# Patient Record
Sex: Female | Born: 1935 | Race: White | Hispanic: No | Marital: Married | Smoking: Never smoker
Health system: Southern US, Community
[De-identification: ages and names within clinical notes are randomized; demographics above are authoritative.]

## PROBLEM LIST (undated history)

## (undated) DIAGNOSIS — C73 Malignant neoplasm of thyroid gland: Secondary | ICD-10-CM

## (undated) DIAGNOSIS — E785 Hyperlipidemia, unspecified: Secondary | ICD-10-CM

## (undated) DIAGNOSIS — N200 Calculus of kidney: Secondary | ICD-10-CM

## (undated) DIAGNOSIS — M8580 Other specified disorders of bone density and structure, unspecified site: Secondary | ICD-10-CM

## (undated) DIAGNOSIS — E890 Postprocedural hypothyroidism: Secondary | ICD-10-CM

## (undated) DIAGNOSIS — R040 Epistaxis: Secondary | ICD-10-CM

## (undated) DIAGNOSIS — I10 Essential (primary) hypertension: Secondary | ICD-10-CM

## (undated) DIAGNOSIS — R413 Other amnesia: Secondary | ICD-10-CM

## (undated) DIAGNOSIS — T7840XA Allergy, unspecified, initial encounter: Secondary | ICD-10-CM

## (undated) HISTORY — PX: APPENDECTOMY: SHX54

## (undated) HISTORY — DX: Other specified disorders of bone density and structure, unspecified site: M85.80

## (undated) HISTORY — DX: Calculus of kidney: N20.0

## (undated) HISTORY — PX: BREAST SURGERY: SHX581

## (undated) HISTORY — PX: NASAL SEPTUM SURGERY: SHX37

## (undated) HISTORY — DX: Hyperlipidemia, unspecified: E78.5

## (undated) HISTORY — DX: Other amnesia: R41.3

## (undated) HISTORY — PX: BACK SURGERY: SHX140

## (undated) HISTORY — PX: CATARACT EXTRACTION: SUR2

## (undated) HISTORY — DX: Allergy, unspecified, initial encounter: T78.40XA

## (undated) HISTORY — PX: EYE SURGERY: SHX253

## (undated) HISTORY — PX: ABDOMINAL HYSTERECTOMY: SHX81

## (undated) HISTORY — PX: LAMINECTOMY: SHX219

## (undated) HISTORY — DX: Essential (primary) hypertension: I10

## (undated) HISTORY — PX: SUBACROMIAL DECOMPRESSION: SHX5174

## (undated) HISTORY — PX: SINUS IRRIGATION: SHX2411

## (undated) HISTORY — PX: SHOULDER SURGERY: SHX246

## (undated) HISTORY — DX: Postprocedural hypothyroidism: E89.0

## (undated) HISTORY — PX: CARDIAC CATHETERIZATION: SHX172

## (undated) HISTORY — PX: OOPHORECTOMY: SHX86

---

## 1998-08-27 ENCOUNTER — Ambulatory Visit (HOSPITAL_COMMUNITY): Admission: RE | Admit: 1998-08-27 | Discharge: 1998-08-27 | Payer: Self-pay

## 1999-01-13 ENCOUNTER — Emergency Department (HOSPITAL_COMMUNITY): Admission: EM | Admit: 1999-01-13 | Discharge: 1999-01-13 | Payer: Self-pay | Admitting: Emergency Medicine

## 1999-01-31 ENCOUNTER — Other Ambulatory Visit: Admission: RE | Admit: 1999-01-31 | Discharge: 1999-01-31 | Payer: Self-pay | Admitting: Otolaryngology

## 1999-09-11 ENCOUNTER — Ambulatory Visit (HOSPITAL_COMMUNITY): Admission: RE | Admit: 1999-09-11 | Discharge: 1999-09-11 | Payer: Self-pay | Admitting: *Deleted

## 1999-09-29 ENCOUNTER — Inpatient Hospital Stay (HOSPITAL_COMMUNITY): Admission: EM | Admit: 1999-09-29 | Discharge: 1999-09-30 | Payer: Self-pay | Admitting: Emergency Medicine

## 1999-09-29 ENCOUNTER — Encounter: Payer: Self-pay | Admitting: Emergency Medicine

## 1999-10-04 ENCOUNTER — Emergency Department (HOSPITAL_COMMUNITY): Admission: EM | Admit: 1999-10-04 | Discharge: 1999-10-04 | Payer: Self-pay | Admitting: Emergency Medicine

## 1999-10-04 ENCOUNTER — Encounter: Payer: Self-pay | Admitting: Emergency Medicine

## 1999-10-09 ENCOUNTER — Ambulatory Visit (HOSPITAL_COMMUNITY): Admission: RE | Admit: 1999-10-09 | Discharge: 1999-10-09 | Payer: Self-pay | Admitting: *Deleted

## 2000-01-27 ENCOUNTER — Encounter: Payer: Self-pay | Admitting: Urology

## 2000-01-28 ENCOUNTER — Ambulatory Visit (HOSPITAL_COMMUNITY): Admission: RE | Admit: 2000-01-28 | Discharge: 2000-01-28 | Payer: Self-pay | Admitting: Urology

## 2000-02-06 ENCOUNTER — Encounter: Admission: RE | Admit: 2000-02-06 | Discharge: 2000-02-06 | Payer: Self-pay | Admitting: *Deleted

## 2000-03-11 ENCOUNTER — Encounter: Payer: Self-pay | Admitting: Neurosurgery

## 2000-03-11 ENCOUNTER — Encounter: Admission: RE | Admit: 2000-03-11 | Discharge: 2000-03-11 | Payer: Self-pay | Admitting: Neurosurgery

## 2000-09-14 ENCOUNTER — Ambulatory Visit (HOSPITAL_COMMUNITY): Admission: RE | Admit: 2000-09-14 | Discharge: 2000-09-14 | Payer: Self-pay | Admitting: *Deleted

## 2000-10-12 ENCOUNTER — Encounter: Payer: Self-pay | Admitting: Urology

## 2000-10-12 ENCOUNTER — Encounter: Admission: RE | Admit: 2000-10-12 | Discharge: 2000-10-12 | Payer: Self-pay | Admitting: Urology

## 2000-10-16 ENCOUNTER — Encounter: Payer: Self-pay | Admitting: Urology

## 2000-10-16 ENCOUNTER — Encounter: Admission: RE | Admit: 2000-10-16 | Discharge: 2000-10-16 | Payer: Self-pay | Admitting: Urology

## 2000-11-06 ENCOUNTER — Encounter: Payer: Self-pay | Admitting: Urology

## 2000-11-13 ENCOUNTER — Ambulatory Visit (HOSPITAL_COMMUNITY): Admission: RE | Admit: 2000-11-13 | Discharge: 2000-11-13 | Payer: Self-pay | Admitting: Urology

## 2001-10-26 ENCOUNTER — Encounter: Payer: Self-pay | Admitting: Urology

## 2001-10-26 ENCOUNTER — Encounter: Admission: RE | Admit: 2001-10-26 | Discharge: 2001-10-26 | Payer: Self-pay | Admitting: Urology

## 2001-11-30 ENCOUNTER — Ambulatory Visit (HOSPITAL_COMMUNITY): Admission: RE | Admit: 2001-11-30 | Discharge: 2001-11-30 | Payer: Self-pay | Admitting: Internal Medicine

## 2001-11-30 ENCOUNTER — Encounter: Payer: Self-pay | Admitting: Internal Medicine

## 2001-12-09 ENCOUNTER — Other Ambulatory Visit: Admission: RE | Admit: 2001-12-09 | Discharge: 2001-12-09 | Payer: Self-pay | Admitting: Obstetrics and Gynecology

## 2002-09-07 ENCOUNTER — Encounter: Admission: RE | Admit: 2002-09-07 | Discharge: 2002-09-07 | Payer: Self-pay | Admitting: Neurosurgery

## 2002-09-07 ENCOUNTER — Encounter: Payer: Self-pay | Admitting: Neurosurgery

## 2002-09-23 ENCOUNTER — Encounter: Payer: Self-pay | Admitting: Neurosurgery

## 2002-09-23 ENCOUNTER — Encounter: Admission: RE | Admit: 2002-09-23 | Discharge: 2002-09-23 | Payer: Self-pay | Admitting: Neurosurgery

## 2002-11-30 ENCOUNTER — Encounter: Payer: Self-pay | Admitting: Neurosurgery

## 2002-11-30 ENCOUNTER — Encounter: Admission: RE | Admit: 2002-11-30 | Discharge: 2002-11-30 | Payer: Self-pay | Admitting: Neurosurgery

## 2002-12-06 ENCOUNTER — Ambulatory Visit (HOSPITAL_COMMUNITY): Admission: RE | Admit: 2002-12-06 | Discharge: 2002-12-06 | Payer: Self-pay | Admitting: Internal Medicine

## 2002-12-06 ENCOUNTER — Encounter: Payer: Self-pay | Admitting: Internal Medicine

## 2003-01-16 ENCOUNTER — Encounter: Payer: Self-pay | Admitting: Neurosurgery

## 2003-01-18 ENCOUNTER — Encounter: Payer: Self-pay | Admitting: Neurosurgery

## 2003-01-18 ENCOUNTER — Inpatient Hospital Stay (HOSPITAL_COMMUNITY): Admission: RE | Admit: 2003-01-18 | Discharge: 2003-01-20 | Payer: Self-pay | Admitting: Neurosurgery

## 2003-03-21 ENCOUNTER — Encounter (INDEPENDENT_AMBULATORY_CARE_PROVIDER_SITE_OTHER): Payer: Self-pay | Admitting: Cardiology

## 2003-03-21 ENCOUNTER — Ambulatory Visit (HOSPITAL_COMMUNITY): Admission: RE | Admit: 2003-03-21 | Discharge: 2003-03-21 | Payer: Self-pay | Admitting: Cardiology

## 2003-11-10 ENCOUNTER — Ambulatory Visit (HOSPITAL_COMMUNITY): Admission: RE | Admit: 2003-11-10 | Discharge: 2003-11-10 | Payer: Self-pay | Admitting: *Deleted

## 2003-11-10 ENCOUNTER — Encounter (INDEPENDENT_AMBULATORY_CARE_PROVIDER_SITE_OTHER): Payer: Self-pay | Admitting: *Deleted

## 2003-12-11 ENCOUNTER — Ambulatory Visit (HOSPITAL_COMMUNITY): Admission: RE | Admit: 2003-12-11 | Discharge: 2003-12-11 | Payer: Self-pay | Admitting: Internal Medicine

## 2005-04-28 ENCOUNTER — Emergency Department (HOSPITAL_COMMUNITY): Admission: EM | Admit: 2005-04-28 | Discharge: 2005-04-28 | Payer: Self-pay | Admitting: Emergency Medicine

## 2005-06-24 ENCOUNTER — Emergency Department (HOSPITAL_COMMUNITY): Admission: EM | Admit: 2005-06-24 | Discharge: 2005-06-24 | Payer: Self-pay | Admitting: Family Medicine

## 2006-03-02 ENCOUNTER — Ambulatory Visit (HOSPITAL_COMMUNITY): Admission: RE | Admit: 2006-03-02 | Discharge: 2006-03-02 | Payer: Self-pay | Admitting: Internal Medicine

## 2007-03-24 ENCOUNTER — Ambulatory Visit (HOSPITAL_COMMUNITY): Admission: RE | Admit: 2007-03-24 | Discharge: 2007-03-24 | Payer: Self-pay | Admitting: Internal Medicine

## 2008-04-18 ENCOUNTER — Ambulatory Visit (HOSPITAL_COMMUNITY): Admission: RE | Admit: 2008-04-18 | Discharge: 2008-04-18 | Payer: Self-pay | Admitting: Internal Medicine

## 2009-05-28 ENCOUNTER — Ambulatory Visit (HOSPITAL_COMMUNITY): Admission: RE | Admit: 2009-05-28 | Discharge: 2009-05-28 | Payer: Self-pay | Admitting: Internal Medicine

## 2009-09-04 ENCOUNTER — Ambulatory Visit (HOSPITAL_COMMUNITY): Admission: RE | Admit: 2009-09-04 | Discharge: 2009-09-04 | Payer: Self-pay | Admitting: Neurology

## 2010-06-18 ENCOUNTER — Ambulatory Visit (HOSPITAL_COMMUNITY): Admission: RE | Admit: 2010-06-18 | Discharge: 2010-06-18 | Payer: Self-pay | Admitting: Internal Medicine

## 2010-06-30 ENCOUNTER — Emergency Department (HOSPITAL_COMMUNITY): Admission: EM | Admit: 2010-06-30 | Discharge: 2010-06-30 | Payer: Self-pay | Admitting: Emergency Medicine

## 2010-10-27 HISTORY — PX: THYROIDECTOMY: SHX17

## 2011-01-09 LAB — URINE MICROSCOPIC-ADD ON

## 2011-01-09 LAB — URINALYSIS, ROUTINE W REFLEX MICROSCOPIC
Ketones, ur: NEGATIVE mg/dL
Specific Gravity, Urine: 1.024 (ref 1.005–1.030)

## 2011-02-03 ENCOUNTER — Other Ambulatory Visit: Payer: Self-pay | Admitting: Internal Medicine

## 2011-02-11 ENCOUNTER — Ambulatory Visit
Admission: RE | Admit: 2011-02-11 | Discharge: 2011-02-11 | Disposition: A | Discharge status: Home / Self Care | Payer: Medicare Other | Source: Ambulatory Visit | Attending: Internal Medicine | Admitting: Internal Medicine

## 2011-02-14 ENCOUNTER — Other Ambulatory Visit: Payer: Self-pay | Admitting: Internal Medicine

## 2011-02-14 DIAGNOSIS — N83202 Unspecified ovarian cyst, left side: Secondary | ICD-10-CM

## 2011-02-17 ENCOUNTER — Ambulatory Visit
Admission: RE | Admit: 2011-02-17 | Discharge: 2011-02-17 | Disposition: A | Discharge status: Home / Self Care | Payer: Medicare Other | Source: Ambulatory Visit | Attending: Internal Medicine | Admitting: Internal Medicine

## 2011-02-17 DIAGNOSIS — N83202 Unspecified ovarian cyst, left side: Secondary | ICD-10-CM

## 2011-03-14 NOTE — Op Note (Signed)
NAME:  [name], [name]                           ACCOUNT NO.:  [id number]   MEDICAL RECORD NO.:  [id number]                   PATIENT TYPE:  AMB   LOCATION:  ENDO                                 FACILITY:  [facility]   PHYSICIAN:  Georgiana Spinner, M.D.                 DATE OF BIRTH:  07-13-1931   DATE OF PROCEDURE:  11/10/2003  DATE OF DISCHARGE:                                 OPERATIVE REPORT   PROCEDURE:  Upper endoscopy.   INDICATIONS:  GERD.   ANESTHESIA:  Demerol 25 mg, Versed 3 mg.   DESCRIPTION OF PROCEDURE:  With the patient mildly sedated in the left  lateral decubitus position, the Olympus videoscopic endoscope was inserted  in the mouth, passed under direct vision through the esophagus, which  appeared normal, into the stomach.  Fundus, body, antrum, duodenal bulb,  second portion of the duodenum all appeared normal.  From this point the  endoscope was slowly withdrawn taking circumferential views of the duodenal  mucosa until the endoscope had been pulled back into the stomach, placed in  retroflexion to view the stomach from below.  The endoscope was then  straightened and withdrawn, taking circumferential views of the remaining  gastric and esophageal mucosa.  The patient's vital signs and pulse oximetry  remained stable.  The patient tolerated the procedure well without apparent  complications.   FINDINGS:  Unremarkable examination.   PLAN:  Proceed to colonoscopy.                                               Georgiana Spinner, M.D.    GMO/MEDQ  D:  11/10/2003  T:  11/10/2003  Job:  [id number]

## 2011-03-14 NOTE — Op Note (Signed)
   NAME:  [name], [name]                          ACCOUNT NO.:  [id number]   MEDICAL RECORD NO.:  [id number]                   PATIENT TYPE:  INP   LOCATION:                                       FACILITY:  [facility]   PHYSICIAN:  Coletta Memos, M.D.                  DATE OF BIRTH:  1931-03-23   DATE OF PROCEDURE:  DATE OF DISCHARGE:  01/20/2003                                 OPERATIVE REPORT   ADDENDUM TO THE OPERATIVE NOTE:   JOB NUMBER:  [id number]   PROCEDURE:  1. L5 posterolateral arthrodesis.  2. Gill procedure.  3. Morcellized allograft.  4. Morcellized autograft.  5. Nonsegmental instrumentation and pedicle screw placement, L5 and at S1,     Gill procedure.                                               Coletta Memos, M.D.    KC/MEDQ  D:  02/21/2003  T:  02/22/2003  Job:  [id number]

## 2011-03-14 NOTE — Op Note (Signed)
NAME:  [name], [name]                          ACCOUNT NO.:  [id number]   MEDICAL RECORD NO.:  [id number]                   PATIENT TYPE:  INP   LOCATION:                                       FACILITY:  [facility]   PHYSICIAN:  Coletta Memos, M.D.                  DATE OF BIRTH:  Jun 20, 1931   DATE OF PROCEDURE:  DATE OF DISCHARGE:  01/20/2003                                 OPERATIVE REPORT   PREOPERATIVE DIAGNOSES:  1. L5-S1 spondylolisthesis.  2. L5-S1 spondylosis.  3. Degenerative disk disease L5-S1.  4. Low back pain.   POSTOPERATIVE DIAGNOSES:  1. L5-S1 spondylolisthesis.  2. L5-S1 spondylosis.  3. Degenerative disk disease L5-S1.  4. Low back pain.   OPERATION/PROCEDURE:  L5-S1 posterolateral arthrodesis and Gill procedure.   COMPLICATIONS:  None.   SURGEON:  Coletta Memos, M.D.   ANESTHESIA:  General endotracheal anesthesia.   INDICATIONS:  [name] is a 77 year old.  She has profound degenerative  disk disease at L5-S1 and an acquired spondylolisthesis.  Due to continued  and unrelenting back pain, I recommended and she agreed to undergo a lumbar  fusion.   DESCRIPTION OF PROCEDURE:  [name] was brought to the operating room.  Her back was prepped and she was draped in a sterile fashion.  The Foley  catheter was placed under sterile conditions.  She was placed on body rolls  and all pressure points were properly padded.  I infiltrated local  anesthetic underneath the skin.  I then made a skin incision after the  patient was prepped and draped in the sterile fashion.  I exposed the lamina  of L4, L5 and of S1 bilaterally.  I then performed a Gill procedure after  confirming my location with x-ray.  After performing the Gill procedure and  exposing both the L5 and S1 nerve roots, I inspected the disk space to see  if I could place the bone graft there.  I did not think it would be worth  the amount of retraction on the nerve roots that would have been  necessary  to fully decompress them, so I then performed aggressive foraminotomies at  L5 and S1 foramens.  I then placed pedicle screws with fluoroscopic  guidance, two screws in L5, two screws in S1.  Pedicle screws were placed  without difficulty, two in L5, two in S1 after first drilling and tapping  each screw.  Rods were connected and bone  graft was placed laterally.  I then irrigated the wound and then closed the  wound in layered fashion after x-ray showed the screws and rods were all in  good position.  The wound was reapproximated with Vicryl sutures.  Dermabond  was used for sterile dressing.  The patient was awakened, moving all  extremities well.  Coletta Memos, M.D.    KC/MEDQ  D:  02/17/2003  T:  02/19/2003  Job:  [id number]

## 2011-03-14 NOTE — Op Note (Signed)
NAME:  [name], STIVERS                           ACCOUNT NO.:  [id number]   MEDICAL RECORD NO.:  [id number]                   PATIENT TYPE:  AMB   LOCATION:  ENDO                                 FACILITY:  [facility]   PHYSICIAN:  Georgiana Spinner, M.D.                 DATE OF BIRTH:  06-05-1931   DATE OF PROCEDURE:  11/10/2003  DATE OF DISCHARGE:                                 OPERATIVE REPORT   PROCEDURE:  Colonoscopy.   INDICATIONS:  Colon polyp.   ANESTHESIA:  1. Demerol 25 mg.  2. Versed 5 mg.   DESCRIPTION OF PROCEDURE:  With patient mildly sedated in the left lateral  decubitus position, the Olympus videoscopic colonoscope was inserted in the  rectum, passed under vision to the cecum, identified by ileocecal valve and  appendiceal orifice, both of which were photographed.  In the cecum, it was  noted a small polyp adjacent to the bowel.  This was photographed and then  subsequently removed using hot biopsy forceps technique, setting of 20-20  blended current.  With that, the tissue was retrieved.  The endoscope was  withdrawn, taking circumferential views of the remaining colonic mucosa,  stopping only then in the rectum which appeared normal on direct and showed  hemorrhoids on retroflexed view.  The endoscope was straightened and  withdrawn.  The patient's vital signs and pulse oximeter remained stable.  The patient tolerated the procedure well without apparent complications.   FINDINGS:  1. Polyp at cecum.  2. Diverticulosis of sigmoid colon.  3. Internal hemorrhoids.  4. Otherwise, unremarkable examination.   PLAN:  1. Await biopsy report.  2. The patient will call me for results and follow up with me as an     outpatient.                                               Georgiana Spinner, M.D.    GMO/MEDQ  D:  11/10/2003  T:  11/10/2003  Job:  [id number]   cc:   Lenon Curt. Chilton Si, M.D.  [address]  [place]  [place] [address]  Fax: [phone]

## 2011-03-14 NOTE — Op Note (Signed)
[facility]  Patient:    [name], [name]                        MRN: [id number] Proc. Date: 11/13/00 Adm. Date:  [id number] Disc. Date: [id number] Attending:  Londell Moh                           Operative Report  PREOPERATIVE DIAGNOSIS:  Right ureteral calculus.  POSTOPERATIVE DIAGNOSIS:  Passed right ureteral calculus.  OPERATION PERFORMED:  Cystoscopy right retrograde, right ureteroscopy, right double-J catheter insertion.  SURGEON:  Jamison Neighbor, M.D.  ANESTHESIA:  General.  COMPLICATIONS:  None.  DRAINS:  None.  INDICATIONS FOR PROCEDURE:  The patient is a 77 year old female who has a small stone in the midureter.  The patient had been unable to pass them.  The patient was originally suggested for extracorporeal shock wave lithotripsy but she worries that she bruises very easily and did not wish to undergo that procedure.  She is scheduled for ureteroscopy.  The patient notes that she has had a lot of pain in the last few days and feels the stone has moved but she has not passed it or seen it in her urine.  The patient understands the risks and benefits of the procedure and gave full and informed consent.  DESCRIPTION OF PROCEDURE:  After successful induction of general anesthesia, the patient was placed in a dorsal lithotomy position and prepped with Betadine and draped in the usual sterile fashion.  Cystoscopy was performed and the bladder was carefully inspected and was free of any tumor or stones. Both ureteral orifices were normal in configuration and location although we did note some edema around the right ureteral orifice.  The retrograde study showed a somewhat dilated ureter but did not show an obvious filling defect. The collecting system was mildly dilated but there was no real true hydronephrosis.  A guide wire was passed.  The ureteral orifice appeared of normal size, so the ureteroscope was inserted without dilation.   The ureteroscope was advanced all the way up to the UPJ and no stone was seen. The patient and her daughter both have noted that she has had a lot of pain in the last 24 hours and it appears quite likely that she has indeed passed her stone.  The ureteroscope was passed twice to make sure that the stone has not been missed or moved superiorly and was then extracted.  The patient had a 6 French x  26 cm double-J catheter passed and allowed to coil normally in the pelvis as well as in the bladder. The string was left attached and this can be removed in approximately 72 hours. DD:  11/13/00 TD:  11/15/00 Job: [id number]

## 2011-04-02 ENCOUNTER — Other Ambulatory Visit: Payer: Self-pay | Admitting: Internal Medicine

## 2011-04-02 DIAGNOSIS — I779 Disorder of arteries and arterioles, unspecified: Secondary | ICD-10-CM

## 2011-04-02 DIAGNOSIS — R0989 Other specified symptoms and signs involving the circulatory and respiratory systems: Secondary | ICD-10-CM

## 2011-04-09 ENCOUNTER — Other Ambulatory Visit: Payer: Medicare Other

## 2011-04-11 ENCOUNTER — Ambulatory Visit
Admission: RE | Admit: 2011-04-11 | Discharge: 2011-04-11 | Disposition: A | Discharge status: Home / Self Care | Payer: Medicare Other | Source: Ambulatory Visit | Attending: Internal Medicine | Admitting: Internal Medicine

## 2011-04-11 DIAGNOSIS — R0989 Other specified symptoms and signs involving the circulatory and respiratory systems: Secondary | ICD-10-CM

## 2011-04-15 ENCOUNTER — Other Ambulatory Visit: Payer: Self-pay | Admitting: Internal Medicine

## 2011-04-15 DIAGNOSIS — E041 Nontoxic single thyroid nodule: Secondary | ICD-10-CM

## 2011-04-22 ENCOUNTER — Ambulatory Visit
Admission: RE | Admit: 2011-04-22 | Discharge: 2011-04-22 | Disposition: A | Discharge status: Home / Self Care | Payer: Medicare Other | Source: Ambulatory Visit | Attending: Internal Medicine | Admitting: Internal Medicine

## 2011-04-22 DIAGNOSIS — E041 Nontoxic single thyroid nodule: Secondary | ICD-10-CM

## 2011-04-24 ENCOUNTER — Other Ambulatory Visit: Payer: Self-pay | Admitting: Internal Medicine

## 2011-04-24 DIAGNOSIS — E041 Nontoxic single thyroid nodule: Secondary | ICD-10-CM

## 2011-04-29 ENCOUNTER — Other Ambulatory Visit: Payer: Self-pay | Admitting: Interventional Radiology

## 2011-04-29 ENCOUNTER — Ambulatory Visit
Admission: RE | Admit: 2011-04-29 | Discharge: 2011-04-29 | Disposition: A | Discharge status: Home / Self Care | Payer: Medicare Other | Source: Ambulatory Visit | Attending: Internal Medicine | Admitting: Internal Medicine

## 2011-04-29 ENCOUNTER — Other Ambulatory Visit (HOSPITAL_COMMUNITY)
Admission: RE | Admit: 2011-04-29 | Discharge: 2011-04-29 | Disposition: A | Discharge status: Home / Self Care | Payer: Medicare Other | Source: Ambulatory Visit | Attending: Interventional Radiology | Admitting: Interventional Radiology

## 2011-04-29 DIAGNOSIS — E041 Nontoxic single thyroid nodule: Secondary | ICD-10-CM

## 2011-04-29 DIAGNOSIS — E049 Nontoxic goiter, unspecified: Secondary | ICD-10-CM | POA: Insufficient documentation

## 2011-05-14 ENCOUNTER — Ambulatory Visit (INDEPENDENT_AMBULATORY_CARE_PROVIDER_SITE_OTHER): Payer: Medicare Other | Admitting: Surgery

## 2011-05-14 ENCOUNTER — Encounter (INDEPENDENT_AMBULATORY_CARE_PROVIDER_SITE_OTHER): Payer: Self-pay | Admitting: Surgery

## 2011-05-14 DIAGNOSIS — E042 Nontoxic multinodular goiter: Secondary | ICD-10-CM

## 2011-05-14 NOTE — Progress Notes (Signed)
Chief Complaint  Patient presents with  . Thyroid Nodule    atypia on biopsy    HISTORY: Patient is a 77 year old white female with recently diagnosed thyroid nodules. Patient had been undergoing a carotid duplex study. She was noted to have thyroid nodules in the left thyroid lobe. Patient underwent a dedicated thyroid ultrasound in June 2012. This showed bilateral thyroid nodules with a dominant 2.4 cm nodule in the inferior pole of the left thyroid lobe. On April 29, 2011 a fine needle aspiration biopsy was performed. This showed cytologic atypia. Nuclear grooves were identified. Patient is referred for consideration for thyroidectomy for definitive diagnosis.  Patient has no prior history of thyroid disease. She has had no prior head or neck surgery. She has never been on thyroid medication. There is a significant history of thyroid disease in her family. Her daughter had thyroid cancer. Her sister and her father both had thyroid goiters.   Past Medical History  Diagnosis Date  . Hypertension   . Hyperlipidemia   . Allergy      Current outpatient prescriptions:aspirin 325 MG tablet, Take 325 mg by mouth daily.  , Disp: , Rfl: ;  bisoprolol (ZEBETA) 10 MG tablet, Take 10 mg by mouth daily. Taking 1/2 a day.... 5 mg  , Disp: , Rfl: ;  calcium carbonate (OS-CAL) 600 MG TABS, Take 600 mg by mouth daily.  , Disp: , Rfl: ;  estrogens conjugated, synthetic A, (CENESTIN) 1.25 MG tablet, Take 1.25 mg by mouth daily.  , Disp: , Rfl:  rosuvastatin (CRESTOR) 10 MG tablet, Take 10 mg by mouth daily. Is only taking 1/2 a day..... 5mg   , Disp: , Rfl: ;  Specialty Vitamins Products (CENTRY ADVANTAGE PO), Take by mouth daily.  , Disp: , Rfl: ;  TOVIAZ 4 MG TB24, Daily., Disp: , Rfl: ;  valsartan-hydrochlorothiazide (DIOVAN-HCT) 160-25 MG per tablet, Take 1 tablet by mouth daily.  , Disp: , Rfl:    Allergies  Allergen Reactions  . Iodinated Diagnostic Agents Anaphylaxis  . Bextra (Valdecoxib) Nausea And  Vomiting  . Ceclor (Cefaclor) Nausea And Vomiting  . Celebrex (Celecoxib) Nausea And Vomiting  . Codeine     Makes head feel like its going to explode  . Demerol     Hallucinate   . Doxycycline Nausea And Vomiting  . Erythromycin Nausea And Vomiting  . Mobic Nausea And Vomiting  . Morphine And Related Nausea And Vomiting  . Penicillins Nausea And Vomiting  . Scopolamine     Hallucinate   . Sulfur Nausea And Vomiting  . Tramadol Nausea And Vomiting  . Vicodin (Hydrocodone-Acetaminophen)     hallucinate  . Vioxx (Rofecoxib) Nausea And Vomiting  . Quinine Derivatives Rash     Family History  Problem Relation Age of Onset  . Cancer Mother     breast  . Stroke Mother   . Heart disease Father   . Cancer Father     lung     History  Substance Use Topics  . Smoking status: Never Smoker   . Smokeless tobacco: Not on file  . Alcohol Use: Yes     PERTINENT POSITIVES FROM REVIEW OF SYSTEMS: No history of dysphagia. No masses. No discomfort. No voice changes. No palpitations. No tremors.   EXAM: Filed Vitals:   05/14/11 1207  BP: 126/74  Pulse: 60  Temp: 96.5 F (35.8 C)   HEENT shows her to be normocephalic. Sclerae are clear. Patient does have vision loss in the  left eye. Dentition fair. Mucous membranes moist. First quality normal. Neck is symmetric. Palpation reveals a dominant nodule in the lower left thyroid lobe. This measures approximately 2.5 cm. It is my swallowing. It is nontender. Remainder of the gland is nodular without dominant or discrete mass. No anterior or posterior cervical lymphadenopathy. No supraclavicular masses. Chest is clear to auscultation without rales rhonchi or wheezes Cardiac exam shows regular rate and rhythm without murmur. Extremities are nontender without edema. Neurologic exam shows her to be alert and oriented. No gross focal deficits. No sign of tremor.   LABORATORY RESULTS: See E-Chart for most recent results   RADIOLOGY  RESULTS: See E-Chart or I-Site for most recent results   IMPRESSION: Multinodular thyroid, dominant left thyroid nodule, 2.4 cm, with cytologic atypia on fine needle aspiration biopsy   PLAN: The patient and I discussed at length with her husband the above findings. I emphasized to them that the needle biopsy did not show definitive malignancy. I think the chances of malignancy in the left thyroid lobe approximately 50%. However, I think she will require a thyroidectomy for definitive diagnosis. We discussed thyroidectomy given the fact that she has bilateral thyroid nodules. We've discussed the risks and benefits of the procedure including recurrent laryngeal nerve injury and injury to parathyroid glands. We discussed the need for lifelong thyroid hormone replacement. In the event of malignancy, we have discussed the need for radioactive iodine treatment. Patient and her husband understand and agree to proceed with surgery. We will make arrangements for her procedure in the near future.  The risks and benefits of the procedure have been discussed at length with the patient.  The patient understands the proposed procedure, potential alternative treatments, and the course of recovery to be expected.  All of the patient's questions have been answered at this time.  The patient wishes to proceed with surgery and will schedule a date for their procedure through our office staff.

## 2011-06-02 ENCOUNTER — Other Ambulatory Visit (INDEPENDENT_AMBULATORY_CARE_PROVIDER_SITE_OTHER): Payer: Self-pay | Admitting: Surgery

## 2011-06-02 ENCOUNTER — Encounter (HOSPITAL_COMMUNITY): Payer: Medicare Other

## 2011-06-02 LAB — SURGICAL PCR SCREEN
MRSA, PCR: NEGATIVE
Staphylococcus aureus: NEGATIVE

## 2011-06-02 LAB — CBC
Hemoglobin: 12.6 g/dL (ref 12.0–15.0)
Platelets: 308 10*3/uL (ref 150–400)
RBC: 4.45 MIL/uL (ref 3.87–5.11)
WBC: 9.6 10*3/uL (ref 4.0–10.5)

## 2011-06-02 LAB — DIFFERENTIAL
Basophils Relative: 1 % (ref 0–1)
Eosinophils Absolute: 0.4 10*3/uL (ref 0.0–0.7)
Lymphs Abs: 4 10*3/uL (ref 0.7–4.0)
Monocytes Relative: 7 % (ref 3–12)
Neutro Abs: 4.4 10*3/uL (ref 1.7–7.7)
Neutrophils Relative %: 47 % (ref 43–77)

## 2011-06-02 LAB — BASIC METABOLIC PANEL
Calcium: 10.6 mg/dL — ABNORMAL HIGH (ref 8.4–10.5)
GFR calc Af Amer: >60 mL/min (ref >60)
GFR calc non Af Amer: >60 mL/min (ref >60)
Potassium: 3.9 mEq/L (ref 3.5–5.1)
Sodium: 136 mEq/L (ref 135–145)

## 2011-06-02 LAB — URINALYSIS, ROUTINE W REFLEX MICROSCOPIC
Glucose, UA: NEGATIVE mg/dL
Leukocytes, UA: NEGATIVE
Nitrite: NEGATIVE
Specific Gravity, Urine: 1.012 (ref 1.005–1.030)
pH: 6.5 (ref 5.0–8.0)

## 2011-06-02 LAB — PROTIME-INR
INR: 0.91 (ref 0.00–1.49)
Prothrombin Time: 12.4 seconds (ref 11.6–15.2)

## 2011-06-12 ENCOUNTER — Observation Stay (HOSPITAL_COMMUNITY)
Admission: RE | Admit: 2011-06-12 | Discharge: 2011-06-13 | Disposition: A | Discharge status: Home / Self Care | Payer: Medicare Other | Source: Ambulatory Visit | Attending: Surgery | Admitting: Surgery

## 2011-06-12 ENCOUNTER — Other Ambulatory Visit (INDEPENDENT_AMBULATORY_CARE_PROVIDER_SITE_OTHER): Payer: Self-pay | Admitting: Surgery

## 2011-06-12 DIAGNOSIS — E785 Hyperlipidemia, unspecified: Secondary | ICD-10-CM | POA: Insufficient documentation

## 2011-06-12 DIAGNOSIS — Z8673 Personal history of transient ischemic attack (TIA), and cerebral infarction without residual deficits: Secondary | ICD-10-CM | POA: Insufficient documentation

## 2011-06-12 DIAGNOSIS — C73 Malignant neoplasm of thyroid gland: Secondary | ICD-10-CM

## 2011-06-12 DIAGNOSIS — E042 Nontoxic multinodular goiter: Principal | ICD-10-CM | POA: Insufficient documentation

## 2011-06-12 DIAGNOSIS — I10 Essential (primary) hypertension: Secondary | ICD-10-CM | POA: Insufficient documentation

## 2011-06-12 DIAGNOSIS — Z79899 Other long term (current) drug therapy: Secondary | ICD-10-CM | POA: Insufficient documentation

## 2011-06-12 DIAGNOSIS — Z01810 Encounter for preprocedural cardiovascular examination: Secondary | ICD-10-CM | POA: Insufficient documentation

## 2011-06-12 DIAGNOSIS — Z01812 Encounter for preprocedural laboratory examination: Secondary | ICD-10-CM | POA: Insufficient documentation

## 2011-06-13 NOTE — Op Note (Signed)
[name], [name]                 ACCOUNT NO.:  [id number]  MEDICAL RECORD NO.:  [id number]  LOCATION:  1534                         FACILITY:  [facility]  PHYSICIAN:  Velora Heckler, MD      DATE OF BIRTH:  [birth year]  DATE OF PROCEDURE:  06/12/2011                               OPERATIVE REPORT   PREOPERATIVE DIAGNOSIS:  Multinodular thyroid with cytologic atypia.  POSTOPERATIVE DIAGNOSIS:  Multinodular thyroid with cytologic atypia.  PROCEDURE:  Total thyroidectomy.  SURGEON:  Velora Heckler, MD, FACS  ASSISTANT:  Lodema Pilot, DO  ANESTHESIA:  General per Dr. Ronelle Nigh.  ESTIMATED BLOOD LOSS:  Minimal.  PREPARATION:  Poor prep.  COMPLICATIONS:  None.  INDICATIONS:  The patient is a 77 year old white female with recently diagnosed thyroid nodules.  This was found incidentally on a carotid duplex study.  Dominant nodule measured 2.4 cm in the inferior pole of the left thyroid lobe.  Fine-needle aspiration biopsy showed significant cytologic atypia.  The patient is now referred for excision for definitive diagnosis.  DESCRIPTION OF PROCEDURE:  Procedure was done in OR #6 at the [facility].  The patient was brought to the operating room, placed in the supine position on the operating room table.  Following administration of general anesthesia, the patient was positioned and then prepped and draped in the usual strict aseptic fashion.  After ascertaining that an adequate level of anesthesia had been achieved, a Kocher incision was made with #15 blade.  Dissection was carried through subcutaneous tissues and platysma.  Hemostasis was obtained with the electrocautery.  Skin flaps were elevated cephalad and caudad from the thyroid notch to the sternal notch.  A Mahorner self-retaining retractor was placed for exposure.  Strap muscles were incised in the midline. Dissection was begun on the left side.  The left thyroid lobe was exposed.  Venous  tributaries were divided between Ligaclips with the harmonic scalpel.  Superior pole vessels were divided individually between Ligaclips with the harmonic scalpel.  Parathyroid tissue was identified and preserved.  Gland was rolled anteriorly.  Inferior venous tributaries were divided between Ligaclips with the harmonic scalpel. Branches of the inferior thyroid artery were divided between small Ligaclips with the harmonic scalpel.  Recurrent laryngeal nerve was identified and preserved.  Ligament of Allyson Sabal was released with the electrocautery and the gland was mobilized up and onto the anterior trachea.  The isthmus was mobilized across the midline.  There was no significant pyramidal lobe seen.  Dry pack was placed in the left neck.  Next, we turned our attention to the right thyroid lobe.  Again strap muscles were reflected laterally.  Right lobe was slightly larger than the left.  Middle thyroid vein was divided between Ligaclips with the harmonic scalpel.  Superior pole vessels were dissected out individually and divided between medium Ligaclips with the harmonic scalpel.  Gland was rolled anteriorly.  Branches of the inferior thyroid artery were divided between small hemoclips.  Inferior venous tributaries were divided between medium Ligaclips with the harmonic scalpel.  Recurrent nerve was identified and preserved.  Ligament of Allyson Sabal was released with electrocautery and the remainder of the isthmus  was excised off the tracheal cartilage with the electrocautery.  Sutures were used to mark the left superior pole.  The entire thyroid gland was submitted to pathology for review.  Neck was irrigated with warm saline.  Good hemostasis was achieved bilaterally.  Surgicel was placed throughout the operative field.  Strap muscles were reapproximated in the midline with interrupted 3-0 Vicryl sutures.  Platysma was closed with interrupted 3-0 Vicryl sutures.  Skin was closed with running a  4-0 Monocryl subcuticular suture.  Wound was washed and dried, and Benzoin and Steri-Strips were applied.  Sterile dressings were applied.  The patient was awakened from anesthesia and brought to the recovery room.  The patient tolerated the procedure well.   Velora Heckler, MD, FACS     TMG/MEDQ  D:  06/12/2011  T:  06/13/2011  Job:  [id number]  Electronically Signed by Darnell Level MD on 06/13/2011 08:05:54 AM

## 2011-06-16 NOTE — Discharge Summary (Signed)
  NAMECAYENNE, [name]                 ACCOUNT NO.:  [id number]  MEDICAL RECORD NO.:  [id number]  LOCATION:  1534                         FACILITY:  [facility]  PHYSICIAN:  Velora Heckler, MD      DATE OF BIRTH:  1931-03-31  DATE OF ADMISSION:  06/12/2011 DATE OF DISCHARGE:  06/13/2011                              DISCHARGE SUMMARY   REASON FOR ADMISSION:  Multinodular thyroid with cytologic atypia.  BRIEF HISTORY:  The patient is a 77 year old white female recently diagnosed on carotid duplex study with incidental finding of thyroid nodules.  Thyroid ultrasound was subsequently performed and a fine needle aspiration of a dominant left-sided nodule was performed.  This showed cytologic atypia.  The patient now comes to surgery for thyroidectomy for definitive diagnosis.  HOSPITAL COURSE:  The patient was admitted on June 12, 2011.  She was taken to the operating room where she underwent total thyroidectomy without complication.  Postoperative course was straightforward.  Serum calcium levels remained stable at 9.3 and a level of 9.2 on the morning following surgery.  The patient was prepared for discharge.  DISCHARGE PLANNING:  The patient is discharged to home on June 13, 2011, in good condition, tolerating a regular diet, and ambulating independently.  DISCHARGE MEDICATIONS: 1. Tramadol 50 mg tablet. 2. Phenergan 25 mg tablet. 3. Synthroid 88 mcg tablet. 4. Calcium tablets twice daily.  The patient will be seen back in my office at [facility] in 2 weeks.  We will check a calcium level prior to her office visit.  FINAL DIAGNOSIS:  Multinodular thyroid with cytologic atypia, final pathologic results pending at the time of discharge.  CONDITION AT DISCHARGE:  Good.     Velora Heckler, MD     TMG/MEDQ  D:  06/13/2011  T:  06/13/2011  Job:  [id number]  cc:   Dorisann Frames, M.D. Fax: [phone]  Electronically Signed by Darnell Level MD on 06/16/2011  02:27:40 PM

## 2011-06-18 ENCOUNTER — Telehealth (INDEPENDENT_AMBULATORY_CARE_PROVIDER_SITE_OTHER): Payer: Self-pay

## 2011-06-18 ENCOUNTER — Telehealth (INDEPENDENT_AMBULATORY_CARE_PROVIDER_SITE_OTHER): Payer: Self-pay | Admitting: Surgery

## 2011-06-18 DIAGNOSIS — C73 Malignant neoplasm of thyroid gland: Secondary | ICD-10-CM | POA: Insufficient documentation

## 2011-06-18 NOTE — Telephone Encounter (Signed)
Patient call for path results.

## 2011-06-18 NOTE — Telephone Encounter (Signed)
Called surgical path results to patient.  Multifocal papillary thyroid cancer.  Patient to see Dr. Dorisann Frames.  Follow up at [facility] scheduled. TMG

## 2011-06-19 ENCOUNTER — Other Ambulatory Visit (INDEPENDENT_AMBULATORY_CARE_PROVIDER_SITE_OTHER): Payer: Self-pay | Admitting: Surgery

## 2011-06-19 DIAGNOSIS — C73 Malignant neoplasm of thyroid gland: Secondary | ICD-10-CM

## 2011-06-25 ENCOUNTER — Ambulatory Visit (INDEPENDENT_AMBULATORY_CARE_PROVIDER_SITE_OTHER): Payer: Medicare Other | Admitting: Surgery

## 2011-06-25 VITALS — BP 116/74 | HR 66 | Temp 97.2°F

## 2011-06-25 DIAGNOSIS — C73 Malignant neoplasm of thyroid gland: Secondary | ICD-10-CM

## 2011-06-25 NOTE — Patient Instructions (Signed)
  COCOA BUTTER & VITAMIN E CREAM  ([employer] or other brand)  Apply cocoa butter/vitamin E cream to your incision 2 - 3 times daily.  Massage cream into incision for one minute with each application.  Use sunscreen (50 SPF or higher) for first 6 months after surgery.  You may substitute Mederma or other scar reducing creams as desired.   

## 2011-06-25 NOTE — Progress Notes (Signed)
Visit Diagnoses: 1. Thyroid cancer, multifocal papillary thryroid cancer     HISTORY: Patient is a 77 year old female who returns post operatively for evaluation after thyroidectomy. Final pathology shows multifocal papillary thyroid carcinoma. Patient is seeing her endocrinologist this afternoon to discuss adjuvant treatment with radioactive iodine.   EXAM: Surgical wound is healing nicely. Minimal soft tissue swelling. No seroma. No sign of infection. His quality is normal.   IMPRESSION: Multifocal papillary thyroid carcinoma, T3, NX, MX   PLAN: Patient is being evaluated this afternoon by her endocrinologist. They will discuss adjuvant radioactive iodine treatment. Patient will begin applying topical creams to her incision. She will return to see me for followup in 6 weeks.    Velora Heckler, MD, FACS General & Endocrine Surgery [facility]

## 2011-06-26 ENCOUNTER — Other Ambulatory Visit (HOSPITAL_COMMUNITY): Payer: Self-pay | Admitting: Endocrinology

## 2011-06-26 DIAGNOSIS — C73 Malignant neoplasm of thyroid gland: Secondary | ICD-10-CM

## 2011-07-09 ENCOUNTER — Encounter (HOSPITAL_COMMUNITY)
Admission: RE | Admit: 2011-07-09 | Discharge: 2011-07-09 | Disposition: A | Discharge status: Home / Self Care | Payer: Medicare Other | Source: Ambulatory Visit | Attending: Endocrinology | Admitting: Endocrinology

## 2011-07-09 DIAGNOSIS — C73 Malignant neoplasm of thyroid gland: Secondary | ICD-10-CM | POA: Insufficient documentation

## 2011-07-10 ENCOUNTER — Encounter (HOSPITAL_COMMUNITY)
Admission: RE | Admit: 2011-07-10 | Discharge: 2011-07-10 | Disposition: A | Discharge status: Home / Self Care | Payer: Medicare Other | Source: Ambulatory Visit | Attending: Endocrinology | Admitting: Endocrinology

## 2011-07-10 DIAGNOSIS — C73 Malignant neoplasm of thyroid gland: Secondary | ICD-10-CM | POA: Insufficient documentation

## 2011-07-11 ENCOUNTER — Encounter (HOSPITAL_COMMUNITY)
Admission: RE | Admit: 2011-07-11 | Discharge: 2011-07-11 | Disposition: A | Discharge status: Home / Self Care | Payer: Medicare Other | Source: Ambulatory Visit | Attending: Endocrinology | Admitting: Endocrinology

## 2011-07-11 ENCOUNTER — Encounter (HOSPITAL_COMMUNITY): Payer: Self-pay

## 2011-07-11 DIAGNOSIS — C73 Malignant neoplasm of thyroid gland: Secondary | ICD-10-CM | POA: Insufficient documentation

## 2011-07-11 HISTORY — DX: Malignant neoplasm of thyroid gland: C73

## 2011-07-11 MED ORDER — SODIUM IODIDE I 131 CAPSULE
107.4000 | Freq: Once | INTRAVENOUS | Status: AC | PRN
  Administered 2011-07-11: 107.4 via ORAL

## 2011-07-18 ENCOUNTER — Encounter (HOSPITAL_COMMUNITY)
Admission: RE | Admit: 2011-07-18 | Discharge: 2011-07-18 | Disposition: A | Discharge status: Home / Self Care | Payer: Medicare Other | Source: Ambulatory Visit | Attending: Endocrinology | Admitting: Endocrinology

## 2011-07-18 DIAGNOSIS — C73 Malignant neoplasm of thyroid gland: Secondary | ICD-10-CM | POA: Insufficient documentation

## 2011-08-06 ENCOUNTER — Ambulatory Visit (INDEPENDENT_AMBULATORY_CARE_PROVIDER_SITE_OTHER): Payer: Medicare Other | Admitting: Surgery

## 2011-08-06 ENCOUNTER — Encounter (INDEPENDENT_AMBULATORY_CARE_PROVIDER_SITE_OTHER): Payer: Self-pay | Admitting: Surgery

## 2011-08-06 VITALS — BP 122/80 | HR 70 | Temp 97.2°F | Resp 14 | Ht 62.5 in | Wt 143.4 lb

## 2011-08-06 DIAGNOSIS — C73 Malignant neoplasm of thyroid gland: Secondary | ICD-10-CM

## 2011-08-06 NOTE — Progress Notes (Signed)
Visit Diagnoses: 1. Thyroid cancer, multifocal papillary thryroid cancer     HISTORY: Patient returns for postoperative wound check. She did undergo radioactive iodine treatment on July 11, 2011. She received 107 mCi of I-131. She is doing well.  EXAM: Surgical wound is well healed. No sign of infection. First quality is normal.  IMPRESSION: Multifocal papillary thyroid carcinoma, no evidence of recurrent disease  PLAN: The patient will continue followup with her endocrinologist. We will monitor her TSH level and her thyroglobulin level on a regular basis. She will likely require a total body iodine scan again in one year.  Patient will return for followup in 6 months.   Velora Heckler, MD, FACS General & Endocrine Surgery [facility]   Primary: Juline Patch, MD Endocrinologist:  Dorisann Frames, MD

## 2011-08-06 NOTE — Patient Instructions (Signed)
  COCOA BUTTER & VITAMIN E CREAM  ([employer] or other brand)  Apply cocoa butter/vitamin E cream to your incision 2 - 3 times daily.  Massage cream into incision for one minute with each application.  Use sunscreen (50 SPF or higher) for first 6 months after surgery.  You may substitute Mederma or other scar reducing creams as desired.   

## 2011-08-07 ENCOUNTER — Other Ambulatory Visit (HOSPITAL_COMMUNITY): Payer: Self-pay | Admitting: Internal Medicine

## 2011-08-07 DIAGNOSIS — Z1231 Encounter for screening mammogram for malignant neoplasm of breast: Secondary | ICD-10-CM

## 2011-08-26 ENCOUNTER — Ambulatory Visit (HOSPITAL_COMMUNITY)
Admission: RE | Admit: 2011-08-26 | Discharge: 2011-08-26 | Disposition: A | Discharge status: Home / Self Care | Payer: Medicare Other | Source: Ambulatory Visit | Attending: Internal Medicine | Admitting: Internal Medicine

## 2011-08-26 DIAGNOSIS — Z1231 Encounter for screening mammogram for malignant neoplasm of breast: Secondary | ICD-10-CM

## 2012-02-12 ENCOUNTER — Ambulatory Visit (INDEPENDENT_AMBULATORY_CARE_PROVIDER_SITE_OTHER): Payer: Medicare Other | Admitting: Surgery

## 2012-02-12 ENCOUNTER — Encounter (INDEPENDENT_AMBULATORY_CARE_PROVIDER_SITE_OTHER): Payer: Self-pay | Admitting: Surgery

## 2012-02-12 VITALS — BP 116/76 | HR 68 | Temp 97.7°F | Resp 16 | Ht 62.5 in | Wt 147.2 lb

## 2012-02-12 DIAGNOSIS — C73 Malignant neoplasm of thyroid gland: Secondary | ICD-10-CM

## 2012-02-12 NOTE — Progress Notes (Signed)
Visit Diagnoses: 1. Thyroid cancer, multifocal papillary thryroid cancer    HISTORY: Patient is a 78 year old white female who underwent total thyroidectomy for multifocal papillary thyroid carcinoma in 2012. She was treated with adjuvant radioactive iodine in September 2012. She is currently taking Synthroid 100 mcg daily. Recent laboratory studies in April 2013 show her TSH level to be appropriately suppressed at 0.172. Patient presents today for a scheduled followup.  PERTINENT REVIEW OF SYSTEMS: Patient notes some mild this changes. These are not significant. Patient has noted some swelling in the area of the parotid glands with certain food intake. This has been more prominent since her thyroid hormone dosage was increased. She denies palpitations. She denies tremors.  EXAM: HEENT: normocephalic; pupils equal and reactive; sclerae clear; dentition good; mucous membranes moist NECK:  Well-healed surgical incision; no palpable masses in the thyroid bed; symmetric on extension; no palpable anterior or posterior cervical lymphadenopathy; no supraclavicular masses; no tenderness CHEST: clear to auscultation bilaterally without rales, rhonchi, or wheezes CARDIAC: regular rate and rhythm without significant murmur; peripheral pulses are full EXT:  non-tender without edema; no deformity NEURO: no gross focal deficits; no sign of tremor   IMPRESSION: History of multifocal papillary thyroid carcinoma, no evidence of recurrent disease  PLAN: The patient is scheduled to see her in the endocrinologist in the near future. I am sure she will have further laboratory testing at that time. This should also include a thyroglobulin level. Patient will likely have a total body iodine scan in the Fall of 2013.  Patient will return to see me for final followup in one year.  Michele Heckler, MD, FACS General & Endocrine Surgery [facility]

## 2012-07-27 ENCOUNTER — Other Ambulatory Visit (HOSPITAL_COMMUNITY): Payer: Self-pay | Admitting: Endocrinology

## 2012-07-27 DIAGNOSIS — C73 Malignant neoplasm of thyroid gland: Secondary | ICD-10-CM

## 2012-08-23 ENCOUNTER — Encounter (HOSPITAL_COMMUNITY)
Admission: RE | Admit: 2012-08-23 | Discharge: 2012-08-23 | Disposition: A | Discharge status: Home / Self Care | Payer: Medicare Other | Source: Ambulatory Visit | Attending: Endocrinology | Admitting: Endocrinology

## 2012-08-23 DIAGNOSIS — C73 Malignant neoplasm of thyroid gland: Secondary | ICD-10-CM | POA: Insufficient documentation

## 2012-08-23 MED ORDER — THYROTROPIN ALFA 1.1 MG IM SOLR
0.9000 mg | INTRAMUSCULAR | Status: AC
  Administered 2012-08-23: 0.9 mg via INTRAMUSCULAR
  Filled 2012-08-23: qty 0.9

## 2012-08-24 ENCOUNTER — Encounter (HOSPITAL_COMMUNITY)
Admission: RE | Admit: 2012-08-24 | Discharge: 2012-08-24 | Disposition: A | Discharge status: Home / Self Care | Payer: Medicare Other | Source: Ambulatory Visit | Attending: Endocrinology | Admitting: Endocrinology

## 2012-08-24 DIAGNOSIS — C73 Malignant neoplasm of thyroid gland: Secondary | ICD-10-CM | POA: Insufficient documentation

## 2012-08-24 MED ORDER — THYROTROPIN ALFA 1.1 MG IM SOLR
0.9000 mg | INTRAMUSCULAR | Status: AC
  Administered 2012-08-24: 0.9 mg via INTRAMUSCULAR
  Filled 2012-08-24: qty 0.9

## 2012-08-25 ENCOUNTER — Encounter (HOSPITAL_COMMUNITY)
Admission: RE | Admit: 2012-08-25 | Discharge: 2012-08-25 | Disposition: A | Discharge status: Home / Self Care | Payer: Medicare Other | Source: Ambulatory Visit | Attending: Endocrinology | Admitting: Endocrinology

## 2012-08-25 DIAGNOSIS — C73 Malignant neoplasm of thyroid gland: Secondary | ICD-10-CM | POA: Insufficient documentation

## 2012-08-27 ENCOUNTER — Encounter (HOSPITAL_COMMUNITY)
Admission: RE | Admit: 2012-08-27 | Discharge: 2012-08-27 | Disposition: A | Discharge status: Home / Self Care | Payer: Medicare Other | Source: Ambulatory Visit | Attending: Endocrinology | Admitting: Endocrinology

## 2012-08-27 DIAGNOSIS — C73 Malignant neoplasm of thyroid gland: Secondary | ICD-10-CM | POA: Insufficient documentation

## 2012-08-27 MED ORDER — SODIUM IODIDE I 131 CAPSULE
3.9000 | Freq: Once | INTRAVENOUS | Status: AC | PRN
  Administered 2012-08-27: 3.9 via ORAL

## 2012-10-12 ENCOUNTER — Emergency Department (HOSPITAL_COMMUNITY)
Admission: EM | Admit: 2012-10-12 | Discharge: 2012-10-12 | Disposition: A | Discharge status: Home / Self Care | Payer: Medicare Other | Attending: Emergency Medicine | Admitting: Emergency Medicine

## 2012-10-12 ENCOUNTER — Encounter (HOSPITAL_COMMUNITY): Payer: Self-pay | Admitting: *Deleted

## 2012-10-12 DIAGNOSIS — R040 Epistaxis: Secondary | ICD-10-CM

## 2012-10-12 DIAGNOSIS — Z7982 Long term (current) use of aspirin: Secondary | ICD-10-CM | POA: Insufficient documentation

## 2012-10-12 DIAGNOSIS — Z85850 Personal history of malignant neoplasm of thyroid: Secondary | ICD-10-CM | POA: Insufficient documentation

## 2012-10-12 DIAGNOSIS — I10 Essential (primary) hypertension: Secondary | ICD-10-CM | POA: Insufficient documentation

## 2012-10-12 DIAGNOSIS — E785 Hyperlipidemia, unspecified: Secondary | ICD-10-CM | POA: Insufficient documentation

## 2012-10-12 DIAGNOSIS — Z79899 Other long term (current) drug therapy: Secondary | ICD-10-CM | POA: Insufficient documentation

## 2012-10-12 HISTORY — DX: Epistaxis: R04.0

## 2012-10-12 NOTE — ED Notes (Signed)
Pt states has had 2 nose bleeds today, states the last one started around 3:20 pm, states she would stick paper towels up nose and it would soak quickly w/ blood. Bleeding controlled at this time and has stopped.

## 2012-10-12 NOTE — ED Provider Notes (Signed)
History    78 year old female with epistaxis. Patient states that she has a history of recurrent nosebleeds, but has not had one recently until today. Previous evaluations by otolaryngology. She states that general this morning was used to control. She had another one later in the day which is unable to which is why she presented to the emergency room. Patient is on aspirin, otherwise no blood thinning medications. Denies dizziness or lightheadedness. No shortness of breath. No n/v.   CSN: [id number]  Arrival date & time 10/12/12  1644   First MD Initiated Contact with Patient 10/12/12 1837      Chief Complaint  Patient presents with  . Epistaxis    (Consider location/radiation/quality/duration/timing/severity/associated sxs/prior treatment) HPI  Past Medical History  Diagnosis Date  . Hypertension   . Hyperlipidemia   . Allergy   . Thyroid cancer   . Bleeding nose     Past Surgical History  Procedure Date  . Appendectomy   . Abdominal hysterectomy     partial  . Oophorectomy   . Breast surgery     augmentation  . Nasal septum surgery   . Laminectomy   . Shoulder surgery     left and right  . Subacromial decompression   . Cataract extraction     both  . Cardiac catheterization   . Sinus irrigation   . Eye surgery     both  . Back surgery     rod and screws    Family History  Problem Relation Age of Onset  . Cancer Mother     breast  . Stroke Mother   . Heart disease Father   . Cancer Father     lung    History  Substance Use Topics  . Smoking status: Never Smoker   . Smokeless tobacco: Never Used  . Alcohol Use: Yes    OB History    Grav Para Term Preterm Abortions TAB SAB Ect Mult Living                  Review of Systems  All systems reviewed and negative, other than as noted in HPI.   Allergies  Iodinated diagnostic agents; Bextra; Ceclor; Celebrex; Codeine; Demerol; Doxycycline; Erythromycin; Meloxicam; Morphine and related;  Penicillins; Scopolamine; Sulfur; Tramadol; Vicodin; Vioxx; and Quinine derivatives  Home Medications   Current Outpatient Rx  Name  Route  Sig  Dispense  Refill  . BION TEARS 0.1-0.3 % OP SOLN   Ophthalmic   Apply 1 drop to eye daily.         . ASPIRIN 325 MG PO TABS   Oral   Take 162.5 mg by mouth daily.          Marland Kitchen BIOTIN 10 MG PO TABS   Oral   Take by mouth.         Marland Kitchen CALCIUM CARBONATE 600 MG PO TABS   Oral   Take 600 mg by mouth daily.          Marland Kitchen ESTROGENS CONJ SYNTHETIC A 1.25 MG PO TABS   Oral   Take 0.9 mg by mouth daily.          Marland Kitchen PREMARIN 0.45 MG PO TABS   Oral   Take 0.45 mg by mouth daily.          . RESTASIS 0.05 % OP EMUL   Both Eyes   Place 1 drop into both eyes every 12 (twelve) hours.          Marland Kitchen  ROSUVASTATIN CALCIUM 10 MG PO TABS   Oral   Take 10 mg by mouth daily. Is only taking 1/2 a day..... 5mg            . SALINE NASAL SPRAY 0.65 % NA SOLN   Nasal   Place 1 spray into the nose as needed. Nasal congestion         . CENTRY ADVANTAGE PO   Oral   Take by mouth daily.           Marland Kitchen SYNTHROID 88 MCG PO TABS   Oral   Take 100 mcg by mouth daily.          Marland Kitchen VALSARTAN-HYDROCHLOROTHIAZIDE 160-25 MG PO TABS   Oral   Take 1 tablet by mouth daily.             BP 136/76  Pulse 57  Temp 98 F (36.7 C) (Oral)  Resp 18  SpO2 96%  Physical Exam  Nursing note and vitals reviewed. Constitutional: She appears well-developed and well-nourished. No distress.  HENT:  Head: Normocephalic and atraumatic.       Dried blood noted in the right nostril. No active bleeding noted. Left nostril clear. Posterior pharynx is clear. Well healed lower anterior surgical scar.  Eyes: Conjunctivae normal are normal. Right eye exhibits no discharge. Left eye exhibits no discharge.  Neck: Neck supple.  Cardiovascular: Normal rate, regular rhythm and normal heart sounds.  Exam reveals no gallop and no friction rub.   No murmur  heard. Pulmonary/Chest: Effort normal and breath sounds normal. No respiratory distress.  Abdominal: Soft. She exhibits no distension. There is no tenderness.  Musculoskeletal: She exhibits no edema and no tenderness.  Neurological: She is alert.  Skin: Skin is warm and dry.  Psychiatric: She has a normal mood and affect. Her behavior is normal. Thought content normal.    ED Course  Procedures (including critical care time)  Labs Reviewed - No data to display No results found.   1. Epistaxis       MDM  78 year old female with epistaxis. Resolved upon my evaluation. No indication for intervention at this time. Return precautions were discussed.       Raeford Razor, MD 10/12/12 [phone]

## 2013-01-10 ENCOUNTER — Telehealth (INDEPENDENT_AMBULATORY_CARE_PROVIDER_SITE_OTHER): Payer: Self-pay | Admitting: General Surgery

## 2013-01-10 NOTE — Telephone Encounter (Signed)
Spoke with patient she will be having her labs drawn on 01/11/13 for Thyroid .she has ov for long term follow up

## 2013-02-14 ENCOUNTER — Ambulatory Visit (INDEPENDENT_AMBULATORY_CARE_PROVIDER_SITE_OTHER): Payer: Medicare Other | Admitting: Surgery

## 2013-02-17 ENCOUNTER — Encounter (INDEPENDENT_AMBULATORY_CARE_PROVIDER_SITE_OTHER): Payer: Self-pay

## 2013-04-11 ENCOUNTER — Encounter (INDEPENDENT_AMBULATORY_CARE_PROVIDER_SITE_OTHER): Payer: Self-pay | Admitting: Surgery

## 2013-04-11 ENCOUNTER — Ambulatory Visit (INDEPENDENT_AMBULATORY_CARE_PROVIDER_SITE_OTHER): Payer: Medicare Other | Admitting: Surgery

## 2013-04-11 VITALS — BP 130/80 | HR 60 | Temp 98.7°F | Resp 15 | Ht 62.0 in | Wt 144.0 lb

## 2013-04-11 DIAGNOSIS — C73 Malignant neoplasm of thyroid gland: Secondary | ICD-10-CM

## 2013-04-11 NOTE — Patient Instructions (Signed)

## 2013-04-11 NOTE — Progress Notes (Signed)
General Surgery [facility]  Visit Diagnoses: 1. Thyroid cancer, multifocal papillary thryroid cancer     HISTORY: Patient is an 79 year old female with a history of multifocal papillary thyroid carcinoma. She is now nearly 2 years out from total thyroidectomy. She was treated with adjuvant radioactive iodine. She currently takes Synthroid 75 mcg daily. Recent TSH level is normal at 0.6. Recent thyroglobulin level is 0.1.  Patient continues close followup with her primary care physician and with her endocrinologist.  PERTINENT REVIEW OF SYSTEMS: No complaints regarding voice, swallowing, or discomfort. Denies tremor. Denies palpitations.  EXAM: HEENT: normocephalic; pupils equal and reactive; sclerae clear; dentition good; mucous membranes moist NECK:  Surgical incision well healed with good cosmetic result; no palpable masses in the thyroid bed; symmetric on extension; no palpable anterior or posterior cervical lymphadenopathy; no supraclavicular masses; no tenderness CHEST: clear to auscultation bilaterally without rales, rhonchi, or wheezes CARDIAC: regular rate and rhythm without significant murmur; peripheral pulses are full EXT:  non-tender without edema; no deformity NEURO: no gross focal deficits; no sign of tremor   IMPRESSION: Personal history of multifocal papillary thyroid carcinoma, no clinical evidence of recurrent disease  PLAN: Patient will continue close follow-up with her primary care physician and her endocrinologist. She will return for surgical care as needed.  Velora Heckler, MD, [facility] Office: [phone]

## 2014-10-31 DIAGNOSIS — H4312 Vitreous hemorrhage, left eye: Secondary | ICD-10-CM | POA: Diagnosis not present

## 2014-10-31 DIAGNOSIS — H35352 Cystoid macular degeneration, left eye: Secondary | ICD-10-CM | POA: Diagnosis not present

## 2014-10-31 DIAGNOSIS — H35052 Retinal neovascularization, unspecified, left eye: Secondary | ICD-10-CM | POA: Diagnosis not present

## 2014-10-31 DIAGNOSIS — H34812 Central retinal vein occlusion, left eye: Secondary | ICD-10-CM | POA: Diagnosis not present

## 2014-11-13 DIAGNOSIS — J069 Acute upper respiratory infection, unspecified: Secondary | ICD-10-CM | POA: Diagnosis not present

## 2014-11-21 DIAGNOSIS — C73 Malignant neoplasm of thyroid gland: Secondary | ICD-10-CM | POA: Diagnosis not present

## 2014-11-21 DIAGNOSIS — E890 Postprocedural hypothyroidism: Secondary | ICD-10-CM | POA: Diagnosis not present

## 2014-12-12 DIAGNOSIS — H34812 Central retinal vein occlusion, left eye: Secondary | ICD-10-CM | POA: Diagnosis not present

## 2014-12-12 DIAGNOSIS — H35352 Cystoid macular degeneration, left eye: Secondary | ICD-10-CM | POA: Diagnosis not present

## 2014-12-19 DIAGNOSIS — H52201 Unspecified astigmatism, right eye: Secondary | ICD-10-CM | POA: Diagnosis not present

## 2014-12-19 DIAGNOSIS — H01004 Unspecified blepharitis left upper eyelid: Secondary | ICD-10-CM | POA: Diagnosis not present

## 2014-12-19 DIAGNOSIS — H01001 Unspecified blepharitis right upper eyelid: Secondary | ICD-10-CM | POA: Diagnosis not present

## 2014-12-19 DIAGNOSIS — H04123 Dry eye syndrome of bilateral lacrimal glands: Secondary | ICD-10-CM | POA: Diagnosis not present

## 2015-01-09 DIAGNOSIS — E780 Pure hypercholesterolemia: Secondary | ICD-10-CM | POA: Diagnosis not present

## 2015-01-09 DIAGNOSIS — I119 Hypertensive heart disease without heart failure: Secondary | ICD-10-CM | POA: Diagnosis not present

## 2015-01-09 DIAGNOSIS — I10 Essential (primary) hypertension: Secondary | ICD-10-CM | POA: Diagnosis not present

## 2015-01-16 DIAGNOSIS — M719 Bursopathy, unspecified: Secondary | ICD-10-CM | POA: Diagnosis not present

## 2015-01-16 DIAGNOSIS — I10 Essential (primary) hypertension: Secondary | ICD-10-CM | POA: Diagnosis not present

## 2015-01-16 DIAGNOSIS — E780 Pure hypercholesterolemia: Secondary | ICD-10-CM | POA: Diagnosis not present

## 2015-01-16 DIAGNOSIS — Z0000 Encounter for general adult medical examination without abnormal findings: Secondary | ICD-10-CM | POA: Diagnosis not present

## 2015-02-01 DIAGNOSIS — H34812 Central retinal vein occlusion, left eye: Secondary | ICD-10-CM | POA: Diagnosis not present

## 2015-02-06 DIAGNOSIS — E890 Postprocedural hypothyroidism: Secondary | ICD-10-CM | POA: Diagnosis not present

## 2015-02-06 DIAGNOSIS — I10 Essential (primary) hypertension: Secondary | ICD-10-CM | POA: Diagnosis not present

## 2015-03-08 DIAGNOSIS — H34812 Central retinal vein occlusion, left eye: Secondary | ICD-10-CM | POA: Diagnosis not present

## 2015-05-24 DIAGNOSIS — H34812 Central retinal vein occlusion, left eye: Secondary | ICD-10-CM | POA: Diagnosis not present

## 2015-05-24 DIAGNOSIS — H4312 Vitreous hemorrhage, left eye: Secondary | ICD-10-CM | POA: Diagnosis not present

## 2015-07-04 DIAGNOSIS — M542 Cervicalgia: Secondary | ICD-10-CM | POA: Diagnosis not present

## 2015-07-09 DIAGNOSIS — M542 Cervicalgia: Secondary | ICD-10-CM | POA: Diagnosis not present

## 2015-07-19 DIAGNOSIS — M542 Cervicalgia: Secondary | ICD-10-CM | POA: Diagnosis not present

## 2015-07-19 DIAGNOSIS — M5032 Other cervical disc degeneration, mid-cervical region: Secondary | ICD-10-CM | POA: Diagnosis not present

## 2015-07-24 DIAGNOSIS — I10 Essential (primary) hypertension: Secondary | ICD-10-CM | POA: Diagnosis not present

## 2015-07-24 DIAGNOSIS — E039 Hypothyroidism, unspecified: Secondary | ICD-10-CM | POA: Diagnosis not present

## 2015-07-24 DIAGNOSIS — E780 Pure hypercholesterolemia: Secondary | ICD-10-CM | POA: Diagnosis not present

## 2015-08-07 DIAGNOSIS — M5032 Other cervical disc degeneration, mid-cervical region, unspecified level: Secondary | ICD-10-CM | POA: Diagnosis not present

## 2015-08-10 DIAGNOSIS — Z23 Encounter for immunization: Secondary | ICD-10-CM | POA: Diagnosis not present

## 2015-08-14 DIAGNOSIS — H34812 Central retinal vein occlusion, left eye, with macular edema: Secondary | ICD-10-CM | POA: Diagnosis not present

## 2015-08-14 DIAGNOSIS — H4312 Vitreous hemorrhage, left eye: Secondary | ICD-10-CM | POA: Diagnosis not present

## 2015-09-24 DIAGNOSIS — E039 Hypothyroidism, unspecified: Secondary | ICD-10-CM | POA: Diagnosis not present

## 2015-09-24 DIAGNOSIS — E7800 Pure hypercholesterolemia, unspecified: Secondary | ICD-10-CM | POA: Diagnosis not present

## 2015-09-24 DIAGNOSIS — Z0000 Encounter for general adult medical examination without abnormal findings: Secondary | ICD-10-CM | POA: Diagnosis not present

## 2015-09-24 DIAGNOSIS — I10 Essential (primary) hypertension: Secondary | ICD-10-CM | POA: Diagnosis not present

## 2015-11-15 DIAGNOSIS — H34812 Central retinal vein occlusion, left eye, with macular edema: Secondary | ICD-10-CM | POA: Diagnosis not present

## 2015-11-29 DIAGNOSIS — E890 Postprocedural hypothyroidism: Secondary | ICD-10-CM | POA: Diagnosis not present

## 2015-11-29 DIAGNOSIS — C73 Malignant neoplasm of thyroid gland: Secondary | ICD-10-CM | POA: Diagnosis not present

## 2015-12-12 DIAGNOSIS — E890 Postprocedural hypothyroidism: Secondary | ICD-10-CM | POA: Diagnosis not present

## 2015-12-25 DIAGNOSIS — H52201 Unspecified astigmatism, right eye: Secondary | ICD-10-CM | POA: Diagnosis not present

## 2015-12-25 DIAGNOSIS — H34812 Central retinal vein occlusion, left eye, with macular edema: Secondary | ICD-10-CM | POA: Diagnosis not present

## 2015-12-25 DIAGNOSIS — H01001 Unspecified blepharitis right upper eyelid: Secondary | ICD-10-CM | POA: Diagnosis not present

## 2015-12-25 DIAGNOSIS — H04123 Dry eye syndrome of bilateral lacrimal glands: Secondary | ICD-10-CM | POA: Diagnosis not present

## 2016-08-16 DIAGNOSIS — R300 Dysuria: Secondary | ICD-10-CM | POA: Diagnosis not present

## 2016-10-31 ENCOUNTER — Encounter (HOSPITAL_COMMUNITY): Payer: Self-pay

## 2016-10-31 ENCOUNTER — Emergency Department (HOSPITAL_COMMUNITY)
Admission: EM | Admit: 2016-10-31 | Discharge: 2016-10-31 | Disposition: A | Discharge status: Home / Self Care | Payer: Medicare Other | Attending: Emergency Medicine | Admitting: Emergency Medicine

## 2016-10-31 DIAGNOSIS — R040 Epistaxis: Secondary | ICD-10-CM | POA: Diagnosis not present

## 2016-10-31 DIAGNOSIS — Z85850 Personal history of malignant neoplasm of thyroid: Secondary | ICD-10-CM | POA: Insufficient documentation

## 2016-10-31 DIAGNOSIS — Z7982 Long term (current) use of aspirin: Secondary | ICD-10-CM | POA: Diagnosis not present

## 2016-10-31 DIAGNOSIS — I10 Essential (primary) hypertension: Secondary | ICD-10-CM | POA: Insufficient documentation

## 2016-10-31 MED ORDER — OXYMETAZOLINE HCL 0.05 % NA SOLN
NASAL | Status: AC
  Administered 2016-10-31: 12:00:00
  Filled 2016-10-31: qty 15

## 2016-10-31 MED ORDER — ACETAMINOPHEN 325 MG PO TABS
650.0000 mg | ORAL_TABLET | Freq: Once | ORAL | Status: AC
  Administered 2016-10-31: 650 mg via ORAL
  Filled 2016-10-31: qty 2

## 2016-10-31 NOTE — ED Provider Notes (Signed)
[place] DEPT Provider Note   CSN: [id number] Arrival date & time: 10/31/16  1115     History   Chief Complaint Chief Complaint  Patient presents with  . Epistaxis    HPI [name] is a 83 y.o. female.  83 year old female presents with acute right sided epistaxis which began just prior to arrival. History of nosebleeds in the past. Stasis again spontaneously and she had tried direct pressure which did not help. Denies being on blood thinners. She denies any nasal trauma. Has had nasal surgery in the past. Nothing makes her symptoms worse.      Past Medical History:  Diagnosis Date  . Allergy   . Bleeding nose   . Hyperlipidemia   . Hypertension   . Thyroid cancer [facility])     Patient Active Problem List   Diagnosis Date Noted  . Thyroid cancer, multifocal papillary thryroid cancer 06/18/2011    Past Surgical History:  Procedure Laterality Date  . ABDOMINAL HYSTERECTOMY     partial  . APPENDECTOMY    . BACK SURGERY     rod and screws  . BREAST SURGERY     augmentation  . CARDIAC CATHETERIZATION    . CATARACT EXTRACTION     both  . EYE SURGERY     both  . LAMINECTOMY    . NASAL SEPTUM SURGERY    . OOPHORECTOMY    . SHOULDER SURGERY     left and right  . SINUS IRRIGATION    . SUBACROMIAL DECOMPRESSION    . THYROIDECTOMY  2012    OB History    No data available       Home Medications    Prior to Admission medications   Medication Sig Start Date End Date Taking? Authorizing Provider  Artificial Tear Solution (BION TEARS) 0.1-0.3 % SOLN Apply 1 drop to eye daily.    Historical Provider, MD  aspirin 325 MG tablet Take 81 mg by mouth daily.     Historical Provider, MD  Biotin 10 MG TABS Take by mouth.    Historical Provider, MD  bisoprolol (ZEBETA) 10 MG tablet  04/08/13   Historical Provider, MD  calcium carbonate (OS-CAL) 600 MG TABS Take 600 mg by mouth daily.     Historical Provider, MD  meloxicam (MOBIC) 15 MG tablet  02/07/13   Historical  Provider, MD  RESTASIS 0.05 % ophthalmic emulsion Place 1 drop into both eyes every 12 (twelve) hours.  01/17/12   Historical Provider, MD  sodium chloride (OCEAN) 0.65 % nasal spray Place 1 spray into the nose as needed. Nasal congestion    Historical Provider, MD  Specialty Vitamins Products (CENTRY ADVANTAGE PO) Take by mouth daily.      Historical Provider, MD  SYNTHROID 88 MCG tablet Take 75 mcg by mouth daily.  07/09/11   Historical Provider, MD  valsartan-hydrochlorothiazide (DIOVAN-HCT) 160-25 MG per tablet Take 1 tablet by mouth daily.      Historical Provider, MD    Family History Family History  Problem Relation Age of Onset  . Cancer Mother     breast  . Stroke Mother   . Heart disease Father   . Cancer Father     lung    Social History Social History  Substance Use Topics  . Smoking status: Never Smoker  . Smokeless tobacco: Never Used  . Alcohol use Yes     Allergies   Iodinated diagnostic agents; Bextra [valdecoxib]; Ceclor [cefaclor]; Celebrex [celecoxib]; Codeine; Demerol; Doxycycline;  Erythromycin; Meloxicam; Morphine and related; Penicillins; Scopolamine; Sulfur; Tramadol; Vicodin [hydrocodone-acetaminophen]; Vioxx [rofecoxib]; and Quinine derivatives   Review of Systems Review of Systems  All other systems reviewed and are negative.    Physical Exam Updated Vital Signs BP 115/96 (BP Location: Left Arm)   Pulse (!) 59   Temp 97.3 F (36.3 C) (Oral)   Resp 20   SpO2 100%   Physical Exam  Constitutional: She is oriented to person, place, and time. She appears well-developed and well-nourished.  Non-toxic appearance. No distress.  HENT:  Head: Normocephalic and atraumatic.  Eyes: Conjunctivae, EOM and lids are normal. Pupils are equal, round, and reactive to light.  Neck: Normal range of motion. Neck supple. No tracheal deviation present. No thyroid mass present.  Cardiovascular: Normal rate, regular rhythm and normal heart sounds.  Exam reveals no  gallop.   No murmur heard. Pulmonary/Chest: Effort normal and breath sounds normal. No stridor. No respiratory distress. She has no decreased breath sounds. She has no wheezes. She has no rhonchi. She has no rales.  Abdominal: Soft. Normal appearance and bowel sounds are normal. She exhibits no distension. There is no tenderness. There is no rebound and no CVA tenderness.  Musculoskeletal: Normal range of motion. She exhibits no edema or tenderness.  Neurological: She is alert and oriented to person, place, and time. She has normal strength. No cranial nerve deficit or sensory deficit. GCS eye subscore is 4. GCS verbal subscore is 5. GCS motor subscore is 6.  Skin: Skin is warm and dry. No abrasion and no rash noted.  Psychiatric: She has a normal mood and affect. Her speech is normal and behavior is normal.  Nursing note and vitals reviewed.    ED Treatments / Results  Labs (all labs ordered are listed, but only abnormal results are displayed) Labs Reviewed - No data to display  EKG  EKG Interpretation None       Radiology No results found.  Procedures .Epistaxis Management Date/Time: 10/31/2016 11:48 AM Performed by: Lacretia Leigh Authorized by: Lacretia Leigh   Consent:    Consent obtained:  Verbal Anesthesia (see MAR for exact dosages):    Anesthesia method:  None Procedure details:    Treatment site:  Unable to specify   Treatment method:  Nasal tampon   Treatment complexity:  Limited Post-procedure details:    Assessment:  Bleeding stopped   Patient tolerance of procedure:  Tolerated well, no immediate complications   (including critical care time)  Medications Ordered in ED Medications - No data to display   Initial Impression / Assessment and Plan / ED Course  I have reviewed the triage vital signs and the nursing notes.  Pertinent labs & imaging results that were available during my care of the patient were reviewed by me and considered in my medical  decision making (see chart for details).  Clinical Course     Patient monitored here and had no further bleeding. Patient educated on how to remove the packing and will do this by tomorrow. Return precautions given  Final Clinical Impressions(s) / ED Diagnoses   Final diagnoses:  None    New Prescriptions New Prescriptions   No medications on file     Lacretia Leigh, MD 10/31/16 1312

## 2016-10-31 NOTE — ED Triage Notes (Signed)
Pt started having nosebleed x 1 hour ago.  Hx of same.  No thinners.  Pt states normally she can get to stop but not as of right now.

## 2016-10-31 NOTE — Discharge Instructions (Signed)
Remove the packing tomorrow evening. Return here for any problems and follow-up with Dr. Wilburn Cornelia next week

## 2016-11-03 DIAGNOSIS — E7800 Pure hypercholesterolemia, unspecified: Secondary | ICD-10-CM | POA: Diagnosis not present

## 2016-11-03 DIAGNOSIS — E039 Hypothyroidism, unspecified: Secondary | ICD-10-CM | POA: Diagnosis not present

## 2016-11-10 ENCOUNTER — Emergency Department (HOSPITAL_COMMUNITY)
Admission: EM | Admit: 2016-11-10 | Discharge: 2016-11-10 | Disposition: A | Discharge status: Home / Self Care | Payer: Medicare Other | Attending: Emergency Medicine | Admitting: Emergency Medicine

## 2016-11-10 ENCOUNTER — Encounter (HOSPITAL_COMMUNITY): Payer: Self-pay | Admitting: Emergency Medicine

## 2016-11-10 DIAGNOSIS — I10 Essential (primary) hypertension: Secondary | ICD-10-CM | POA: Insufficient documentation

## 2016-11-10 DIAGNOSIS — Z7982 Long term (current) use of aspirin: Secondary | ICD-10-CM | POA: Insufficient documentation

## 2016-11-10 DIAGNOSIS — Z79899 Other long term (current) drug therapy: Secondary | ICD-10-CM | POA: Insufficient documentation

## 2016-11-10 DIAGNOSIS — R040 Epistaxis: Secondary | ICD-10-CM

## 2016-11-10 DIAGNOSIS — Z85850 Personal history of malignant neoplasm of thyroid: Secondary | ICD-10-CM | POA: Diagnosis not present

## 2016-11-10 MED ORDER — SILVER NITRATE-POT NITRATE 75-25 % EX MISC
1.0000 | Freq: Once | CUTANEOUS | Status: AC
  Administered 2016-11-10: 1 via TOPICAL
  Filled 2016-11-10: qty 1

## 2016-11-10 MED ORDER — COCAINE HCL 4 % EX SOLN
4.0000 mL | Freq: Once | CUTANEOUS | Status: AC
  Administered 2016-11-10: 4 mL via NASAL
  Filled 2016-11-10: qty 4

## 2016-11-10 NOTE — ED Triage Notes (Signed)
Patient reports epistaxis starting this morning. Patient states she was seen here a little over a week ago for the same thing. Denies any blood thinner use. States she has had surgeries on the area in the past for the bleeding. Patient conscious, alert, oriented. Bleeding is controlled at this time. States she feels "an occasional trickle". Patient states earlier it was running down the back of her throat and down her nose.

## 2016-11-10 NOTE — ED Notes (Signed)
Topical medicines at bedside.

## 2016-11-10 NOTE — ED Provider Notes (Signed)
[place] DEPT Provider Note   CSN: [id number] Arrival date & time: 11/10/16  1129     History   Chief Complaint Chief Complaint  Patient presents with  . Epistaxis    HPI [name] is a 83 y.o. female.  HPI Patient presents to the emergency room with complaints of a recurrent nosebleed. Patient has had intermittent nosebleeds off and on for a while. Usually she can apply pressure and it was stopped. Patient had an episode last week where she had to come into the emergency room. She had nasal packing placed and was instructed to remove it a couple days later. Patient had been doing well until she started having bleeding again today. It lasted for about an hour so so she came into the emergency room. While waiting to be seen, the bleeding stopped.  She is not having any issues now and feels fine.  No syncope.  No lightheadedness.  No bleeding elsewhere.  Past Medical History:  Diagnosis Date  . Allergy   . Bleeding nose   . Hyperlipidemia   . Hypertension   . Thyroid cancer [facility])     Patient Active Problem List   Diagnosis Date Noted  . Thyroid cancer, multifocal papillary thryroid cancer 06/18/2011    Past Surgical History:  Procedure Laterality Date  . ABDOMINAL HYSTERECTOMY     partial  . APPENDECTOMY    . BACK SURGERY     rod and screws  . BREAST SURGERY     augmentation  . CARDIAC CATHETERIZATION    . CATARACT EXTRACTION     both  . EYE SURGERY     both  . LAMINECTOMY    . NASAL SEPTUM SURGERY    . OOPHORECTOMY    . SHOULDER SURGERY     left and right  . SINUS IRRIGATION    . SUBACROMIAL DECOMPRESSION    . THYROIDECTOMY  2012    OB History    No data available       Home Medications    Prior to Admission medications   Medication Sig Start Date End Date Taking? Authorizing Provider  aspirin EC 81 MG tablet Take 81 mg by mouth daily.   Yes Historical Provider, MD  bisoprolol (ZEBETA) 10 MG tablet Take 5 mg by mouth 2 (two) times daily.  10/12/16  Yes Historical Provider, MD  calcium carbonate (OS-CAL) 600 MG TABS Take 600 mg by mouth daily with lunch.    Yes Historical Provider, MD  docusate sodium (COLACE) 100 MG capsule Take 200 mg by mouth at bedtime.   Yes Historical Provider, MD  Polyethyl Glycol-Propyl Glycol (SYSTANE OP) Place 1 drop into both eyes 4 (four) times daily as needed (dry eyes).   Yes Historical Provider, MD  RESTASIS 0.05 % ophthalmic emulsion Place 1 drop into both eyes every 12 (twelve) hours.  01/17/12  Yes Historical Provider, MD  sodium chloride (OCEAN) 0.65 % nasal spray Place 1 spray into the nose daily as needed for congestion. Nasal congestion    Yes Historical Provider, MD  SYNTHROID 75 MCG tablet Take 75 mcg by mouth daily. 09/12/16  Yes Historical Provider, MD  valsartan-hydrochlorothiazide (DIOVAN-HCT) 160-25 MG per tablet Take 1 tablet by mouth daily.     Yes Historical Provider, MD    Family History Family History  Problem Relation Age of Onset  . Cancer Mother     breast  . Stroke Mother   . Heart disease Father   . Cancer Father  lung    Social History Social History  Substance Use Topics  . Smoking status: Never Smoker  . Smokeless tobacco: Never Used  . Alcohol use Yes     Comment: Occasionally     Allergies   Iodinated diagnostic agents; Bextra [valdecoxib]; Ceclor [cefaclor]; Celebrex [celecoxib]; Codeine; Demerol; Doxycycline; Erythromycin; Meloxicam; Morphine and related; Penicillins; Scopolamine; Sulfur; Tramadol; Vicodin [hydrocodone-acetaminophen]; Vioxx [rofecoxib]; and Quinine derivatives   Review of Systems Review of Systems  All other systems reviewed and are negative.    Physical Exam Updated Vital Signs BP 155/77 (BP Location: Right Arm)   Pulse (!) 56   Temp 97.7 F (36.5 C) (Oral)   Resp 12   Ht 5\' 3"  (1.6 m)   Wt 65.3 kg   SpO2 100%   BMI 25.51 kg/m   Physical Exam  Constitutional: She appears well-developed and well-nourished. No  distress.  HENT:  Head: Normocephalic and atraumatic.  Right Ear: External ear normal.  Left Ear: External ear normal.  Nose: Epistaxis is observed.  No active bleeding, small vessel at the anterior aspect of the right anterior nares,   Eyes: Conjunctivae are normal. Right eye exhibits no discharge. Left eye exhibits no discharge. No scleral icterus.  Neck: Neck supple. No tracheal deviation present.  Cardiovascular: Normal rate.   Pulmonary/Chest: Effort normal. No stridor. No respiratory distress.  Abdominal: She exhibits no distension.  Musculoskeletal: She exhibits no edema.  Neurological: She is alert. Cranial nerve deficit: no gross deficits.  Skin: Skin is warm and dry. No rash noted.  Psychiatric: She has a normal mood and affect.  Nursing note and vitals reviewed.    ED Treatments / Results   Procedures .Epistaxis Management Date/Time: 11/10/2016 3:30 PM Performed by: Dorie Rank Authorized by: Dorie Rank   Consent:    Consent obtained:  Verbal   Consent given by:  Patient   Risks discussed:  Bleeding, infection, nasal injury and pain   Alternatives discussed:  No treatment Anesthesia (see MAR for exact dosages):    Anesthesia method:  None Procedure details:    Treatment site:  R anterior   Treatment method:  Silver nitrate   Treatment complexity:  Limited   Treatment episode: initial   Post-procedure details:    Patient tolerance of procedure:  Tolerated well, no immediate complications   (including critical care time)  Medications Ordered in ED Medications  silver nitrate applicators applicator 1 Stick (1 Stick Topical Given by Other 11/10/16 1449)  cocaine 4 % topical solution 4 mL (4 mLs Nasal Given by Other 11/10/16 1449)     Initial Impression / Assessment and Plan / ED Course  I have reviewed the triage vital signs and the nursing notes.  Pertinent labs & imaging results that were available during my care of the patient were reviewed by me and  considered in my medical decision making (see chart for details).  Clinical Course   Patient's bleeding stopped before I evaluated the patient. She had no recurrence of bleeding while she was here. She tolerated the silver nitrate application. Plan on discharge home with outpatient ENT referral  Final Clinical Impressions(s) / ED Diagnoses   Final diagnoses:  Epistaxis      Dorie Rank, MD 11/10/16 1531

## 2016-12-17 DIAGNOSIS — J310 Chronic rhinitis: Secondary | ICD-10-CM | POA: Diagnosis not present

## 2016-12-17 DIAGNOSIS — R040 Epistaxis: Secondary | ICD-10-CM | POA: Diagnosis not present

## 2016-12-18 DIAGNOSIS — Z0000 Encounter for general adult medical examination without abnormal findings: Secondary | ICD-10-CM | POA: Diagnosis not present

## 2016-12-18 DIAGNOSIS — E7800 Pure hypercholesterolemia, unspecified: Secondary | ICD-10-CM | POA: Diagnosis not present

## 2016-12-18 DIAGNOSIS — I10 Essential (primary) hypertension: Secondary | ICD-10-CM | POA: Diagnosis not present

## 2016-12-18 DIAGNOSIS — E890 Postprocedural hypothyroidism: Secondary | ICD-10-CM | POA: Diagnosis not present

## 2016-12-29 DIAGNOSIS — H04223 Epiphora due to insufficient drainage, bilateral lacrimal glands: Secondary | ICD-10-CM | POA: Diagnosis not present

## 2016-12-29 DIAGNOSIS — H43811 Vitreous degeneration, right eye: Secondary | ICD-10-CM | POA: Diagnosis not present

## 2016-12-29 DIAGNOSIS — Z961 Presence of intraocular lens: Secondary | ICD-10-CM | POA: Diagnosis not present

## 2016-12-29 DIAGNOSIS — H52201 Unspecified astigmatism, right eye: Secondary | ICD-10-CM | POA: Diagnosis not present

## 2017-01-28 DIAGNOSIS — E890 Postprocedural hypothyroidism: Secondary | ICD-10-CM | POA: Diagnosis not present

## 2017-01-30 DIAGNOSIS — E890 Postprocedural hypothyroidism: Secondary | ICD-10-CM | POA: Diagnosis not present

## 2017-01-30 DIAGNOSIS — Z85850 Personal history of malignant neoplasm of thyroid: Secondary | ICD-10-CM | POA: Diagnosis not present

## 2017-03-10 DIAGNOSIS — M546 Pain in thoracic spine: Secondary | ICD-10-CM | POA: Diagnosis not present

## 2017-03-20 DIAGNOSIS — N390 Urinary tract infection, site not specified: Secondary | ICD-10-CM | POA: Diagnosis not present

## 2017-03-31 DIAGNOSIS — R040 Epistaxis: Secondary | ICD-10-CM | POA: Diagnosis not present

## 2017-09-03 ENCOUNTER — Emergency Department (HOSPITAL_COMMUNITY)
Admission: EM | Admit: 2017-09-03 | Discharge: 2017-09-03 | Disposition: A | Discharge status: Home / Self Care | Payer: Medicare Other | Attending: Emergency Medicine | Admitting: Emergency Medicine

## 2017-09-03 ENCOUNTER — Encounter (HOSPITAL_COMMUNITY): Payer: Self-pay | Admitting: *Deleted

## 2017-09-03 ENCOUNTER — Other Ambulatory Visit: Payer: Self-pay

## 2017-09-03 DIAGNOSIS — Z7982 Long term (current) use of aspirin: Secondary | ICD-10-CM | POA: Insufficient documentation

## 2017-09-03 DIAGNOSIS — Z79899 Other long term (current) drug therapy: Secondary | ICD-10-CM | POA: Insufficient documentation

## 2017-09-03 DIAGNOSIS — R040 Epistaxis: Secondary | ICD-10-CM | POA: Insufficient documentation

## 2017-09-03 DIAGNOSIS — I10 Essential (primary) hypertension: Secondary | ICD-10-CM | POA: Insufficient documentation

## 2017-09-03 DIAGNOSIS — C73 Malignant neoplasm of thyroid gland: Secondary | ICD-10-CM | POA: Diagnosis not present

## 2017-09-03 LAB — CBC WITH DIFFERENTIAL/PLATELET
BASOS ABS: 0 10*3/uL (ref 0.0–0.1)
BASOS PCT: 0 %
EOS PCT: 2 %
Eosinophils Absolute: 0.2 10*3/uL (ref 0.0–0.7)
HCT: 36.7 % (ref 36.0–46.0)
Hemoglobin: 12.2 g/dL (ref 12.0–15.0)
LYMPHS PCT: 22 %
Lymphs Abs: 2 10*3/uL (ref 0.7–4.0)
MCH: 28.9 pg (ref 26.0–34.0)
MCHC: 33.2 g/dL (ref 30.0–36.0)
MCV: 87 fL (ref 78.0–100.0)
Monocytes Absolute: 0.4 10*3/uL (ref 0.1–1.0)
Monocytes Relative: 4 %
Neutro Abs: 6.3 10*3/uL (ref 1.7–7.7)
Neutrophils Relative %: 72 %
PLATELETS: 270 10*3/uL (ref 150–400)
RBC: 4.22 MIL/uL (ref 3.87–5.11)
RDW: 13.4 % (ref 11.5–15.5)
WBC: 8.8 10*3/uL (ref 4.0–10.5)

## 2017-09-03 LAB — BASIC METABOLIC PANEL
ANION GAP: 8 (ref 5–15)
BUN: 21 mg/dL — AB (ref 6–20)
CALCIUM: 8.9 mg/dL (ref 8.9–10.3)
CO2: 27 mmol/L (ref 22–32)
Chloride: 101 mmol/L (ref 101–111)
Creatinine, Ser: 1.01 mg/dL — ABNORMAL HIGH (ref 0.44–1.00)
GFR calc Af Amer: 58 mL/min — ABNORMAL LOW (ref >60)
GFR, EST NON AFRICAN AMERICAN: 50 mL/min — AB (ref >60)
Glucose, Bld: 156 mg/dL — ABNORMAL HIGH (ref 65–99)
Potassium: 3.2 mmol/L — ABNORMAL LOW (ref 3.5–5.1)
SODIUM: 136 mmol/L (ref 135–145)

## 2017-09-03 MED ORDER — OXYMETAZOLINE HCL 0.05 % NA SOLN: 1.0000 | Freq: Once | NASAL | Status: DC

## 2017-09-03 MED ORDER — CLINDAMYCIN HCL 150 MG PO CAPS: 300.0000 mg | ORAL_CAPSULE | Freq: Three times a day (TID) | ORAL | 0 refills | Status: AC

## 2017-09-03 MED ORDER — LIDOCAINE HCL 4 % EX SOLN
Freq: Once | CUTANEOUS | Status: AC
  Administered 2017-09-03: 10:00:00 via TOPICAL
  Filled 2017-09-03: qty 50

## 2017-09-03 MED ORDER — PHENYLEPHRINE HCL 10 % OP SOLN: Freq: Once | OPHTHALMIC | Status: DC

## 2017-09-03 MED ORDER — OXYMETAZOLINE HCL 0.05 % NA SOLN
1.0000 | Freq: Two times a day (BID) | NASAL | Status: DC
  Administered 2017-09-03: 1 via NASAL
  Filled 2017-09-03: qty 15

## 2017-09-03 MED ORDER — SODIUM CHLORIDE 0.9 % IV BOLUS (SEPSIS)
500.0000 mL | Freq: Once | INTRAVENOUS | Status: AC
  Administered 2017-09-03: 500 mL via INTRAVENOUS

## 2017-09-03 NOTE — ED Notes (Signed)
ED Provider at bedside. 

## 2017-09-03 NOTE — ED Triage Notes (Signed)
C/o nose bleed states she was seen by the ENT yest and had her nose cauterized . Started bleeding this am at 7 am.

## 2017-09-03 NOTE — ED Provider Notes (Signed)
[place] EMERGENCY DEPARTMENT Provider Note   CSN: [id number] Arrival date & time: 09/03/17  8657     History   Chief Complaint Chief Complaint  Patient presents with  . Epistaxis    HPI [name] is a 83 y.o. female.  The history is provided by the patient. No language interpreter was used.  Epistaxis     [name] is a 83 y.o. female who presents to the Emergency Department complaining of epistaxis.  When she awoke at 7 AM this morning as she had a right-sided epistaxis.  She had about 45 minutes of bleeding in the bathroom and she was trying to hold pressure but she had persistent bleeding.  She was seen by Dr. Benjamine Mola with ENT yesterday and had her right anterior nose cauterized.  She does not take any blood thinners.  She did feel lightheaded when she was coming into the emergency department this morning.  Currently she reports a stuffy right nose but no lightheadedness or shortness of breath.  Symptoms are mild to moderate and constant in nature.  At times the bleeding from the right side will go over to the left side. Past Medical History:  Diagnosis Date  . Allergy   . Bleeding nose   . Hyperlipidemia   . Hypertension   . Thyroid cancer [facility])     Patient Active Problem List   Diagnosis Date Noted  . Thyroid cancer, multifocal papillary thryroid cancer 06/18/2011    Past Surgical History:  Procedure Laterality Date  . ABDOMINAL HYSTERECTOMY     partial  . APPENDECTOMY    . BACK SURGERY     rod and screws  . BREAST SURGERY     augmentation  . CARDIAC CATHETERIZATION    . CATARACT EXTRACTION     both  . EYE SURGERY     both  . LAMINECTOMY    . NASAL SEPTUM SURGERY    . OOPHORECTOMY    . SHOULDER SURGERY     left and right  . SINUS IRRIGATION    . SUBACROMIAL DECOMPRESSION    . THYROIDECTOMY  2012    OB History    No data available       Home Medications    Prior to Admission medications   Medication Sig Start Date  End Date Taking? Authorizing Provider  aspirin EC 81 MG tablet Take 81 mg by mouth daily.   Yes [provider]  bisoprolol (ZEBETA) 10 MG tablet Take 5 mg by mouth 2 (two) times daily. 10/12/16  Yes [provider]  calcium carbonate (OS-CAL) 600 MG TABS Take 600 mg by mouth daily with lunch.    Yes [provider]  Polyethyl Glycol-Propyl Glycol (SYSTANE OP) Place 1 drop into both eyes 4 (four) times daily as needed (dry eyes).   Yes [provider]  RESTASIS 0.05 % ophthalmic emulsion Place 1 drop into both eyes every 12 (twelve) hours.  01/17/12  Yes [provider]  sodium chloride (OCEAN) 0.65 % nasal spray Place 1 spray into the nose daily as needed for congestion. Nasal congestion    Yes [provider]  SYNTHROID 75 MCG tablet Take 75 mcg by mouth daily. 09/12/16  Yes [provider]  valsartan-hydrochlorothiazide (DIOVAN-HCT) 160-25 MG per tablet Take 1 tablet by mouth daily.     Yes [provider]  clindamycin (CLEOCIN) 150 MG capsule Take 2 capsules (300 mg total) 3 (three) times daily for 4 days  by mouth. 09/03/17 09/07/17  Quintella Reichert, MD  docusate sodium (COLACE) 100 MG capsule Take 200 mg by mouth at bedtime.    [provider]    Family History Family History  Problem Relation Age of Onset  . Cancer Mother        breast  . Stroke Mother   . Heart disease Father   . Cancer Father        lung    Social History Social History   Tobacco Use  . Smoking status: Never Smoker  . Smokeless tobacco: Never Used  Substance Use Topics  . Alcohol use: Yes    Comment: Occasionally  . Drug use: No     Allergies   Iodinated diagnostic agents; Bextra [valdecoxib]; Ceclor [cefaclor]; Celebrex [celecoxib]; Codeine; Demerol; Doxycycline; Erythromycin; Meloxicam; Morphine and related; Penicillins; Scopolamine; Sulfur; Tramadol; Vicodin [hydrocodone-acetaminophen]; Vioxx [rofecoxib]; and Quinine  derivatives   Review of Systems Review of Systems  HENT: Positive for nosebleeds.   All other systems reviewed and are negative.    Physical Exam Updated Vital Signs BP 118/62 (BP Location: Left Arm)   Pulse 66   Temp 98.1 F (36.7 C)   Resp 17   Ht 5\' 2"  (1.575 m)   Wt 66.2 kg (146 lb)   SpO2 97%   BMI 26.70 kg/m   Physical Exam  Constitutional: She is oriented to person, place, and time. She appears well-developed and well-nourished.  HENT:  Head: Normocephalic and atraumatic.  Dried blood in the left nare.  Moderate blood and clot in the right nare.  Small amount of blood in the posterior oropharynx.  Cardiovascular: Normal rate and regular rhythm.  No murmur heard. Pulmonary/Chest: Effort normal and breath sounds normal. No respiratory distress.  Abdominal: Soft. There is no tenderness. There is no rebound and no guarding.  Musculoskeletal: She exhibits no edema or tenderness.  Neurological: She is alert and oriented to person, place, and time.  Skin: Skin is warm and dry.  Psychiatric: She has a normal mood and affect. Her behavior is normal.  Nursing note and vitals reviewed.    ED Treatments / Results  Labs (all labs ordered are listed, but only abnormal results are displayed) Labs Reviewed  BASIC METABOLIC PANEL - Abnormal; Notable for the following components:      Result Value   Potassium 3.2 (*)    Glucose, Bld 156 (*)    BUN 21 (*)    Creatinine, Ser 1.01 (*)    GFR calc non Af Amer 50 (*)    GFR calc Af Amer 58 (*)    All other components within normal limits  CBC WITH DIFFERENTIAL/PLATELET    EKG  EKG Interpretation None       Radiology No results found.  Procedures .Epistaxis Management Date/Time: 09/03/2017 12:25 PM Performed by: Quintella Reichert, MD Authorized by: Quintella Reichert, MD   Consent:    Consent obtained:  Verbal   Consent given by:  Patient   Risks discussed:  Bleeding, infection, nasal injury and pain Anesthesia  (see MAR for exact dosages):    Anesthesia method:  Topical application   Topical anesthetic:  Lidocaine gel Procedure details:    Treatment site:  R anterior   Treatment method:  Anterior pack and silver nitrate   Treatment episode: recurring   Post-procedure details:    Assessment:  Bleeding decreased   Patient tolerance of procedure:  Tolerated well, no immediate complications   (including critical care time)  Medications Ordered  in ED Medications  sodium chloride 0.9 % bolus 500 mL (0 mLs Intravenous Stopped 09/03/17 1002)  lidocaine (XYLOCAINE) 4 % external solution ( Topical Given 09/03/17 0954)     Initial Impression / Assessment and Plan / ED Course  I have reviewed the triage vital signs and the nursing notes.  Pertinent labs & imaging results that were available during my care of the patient were reviewed by me and considered in my medical decision making (see chart for details).     Patient here for evaluation of epistaxis.  Bleeding was controlled in the emergency department.  Plan to discharge home with ENT follow-up.  She was given clindamycin for infection prophylaxis per ENT recommendations.  Discussed with patient home care, outpatient follow-up and return precautions.  Final Clinical Impressions(s) / ED Diagnoses   Final diagnoses:  Anterior epistaxis    ED Discharge Orders        Ordered    clindamycin (CLEOCIN) 150 MG capsule  3 times daily     09/03/17 1225       Quintella Reichert, MD 09/03/17 1622

## 2017-09-08 DIAGNOSIS — I10 Essential (primary) hypertension: Secondary | ICD-10-CM | POA: Diagnosis not present

## 2017-09-08 DIAGNOSIS — Z85850 Personal history of malignant neoplasm of thyroid: Secondary | ICD-10-CM | POA: Insufficient documentation

## 2017-09-08 DIAGNOSIS — Z7982 Long term (current) use of aspirin: Secondary | ICD-10-CM | POA: Insufficient documentation

## 2017-09-08 DIAGNOSIS — R040 Epistaxis: Secondary | ICD-10-CM | POA: Insufficient documentation

## 2017-09-09 ENCOUNTER — Encounter (HOSPITAL_COMMUNITY): Payer: Self-pay | Admitting: Emergency Medicine

## 2017-09-09 ENCOUNTER — Emergency Department (HOSPITAL_COMMUNITY)
Admission: EM | Admit: 2017-09-09 | Discharge: 2017-09-09 | Disposition: A | Discharge status: Home / Self Care | Payer: Medicare Other | Attending: Emergency Medicine | Admitting: Emergency Medicine

## 2017-09-09 ENCOUNTER — Other Ambulatory Visit: Payer: Self-pay

## 2017-09-09 DIAGNOSIS — R040 Epistaxis: Secondary | ICD-10-CM

## 2017-09-09 LAB — CBC WITH DIFFERENTIAL/PLATELET
BASOS ABS: 0.1 10*3/uL (ref 0.0–0.1)
Basophils Relative: 1 %
EOS ABS: 0.2 10*3/uL (ref 0.0–0.7)
EOS PCT: 2 %
HCT: 34.4 % — ABNORMAL LOW (ref 36.0–46.0)
Hemoglobin: 11.4 g/dL — ABNORMAL LOW (ref 12.0–15.0)
LYMPHS PCT: 41 %
Lymphs Abs: 3.8 10*3/uL (ref 0.7–4.0)
MCH: 28.5 pg (ref 26.0–34.0)
MCHC: 33.1 g/dL (ref 30.0–36.0)
MCV: 86 fL (ref 78.0–100.0)
Monocytes Absolute: 1 10*3/uL (ref 0.1–1.0)
Monocytes Relative: 11 %
Neutro Abs: 4.2 10*3/uL (ref 1.7–7.7)
Neutrophils Relative %: 45 %
PLATELETS: 311 10*3/uL (ref 150–400)
RBC: 4 MIL/uL (ref 3.87–5.11)
RDW: 13.7 % (ref 11.5–15.5)
WBC: 9.2 10*3/uL (ref 4.0–10.5)

## 2017-09-09 MED ORDER — LIDOCAINE-EPINEPHRINE (PF) 2 %-1:200000 IJ SOLN
20.0000 mL | Freq: Once | INTRAMUSCULAR | Status: AC
  Administered 2017-09-09: 20 mL
  Filled 2017-09-09: qty 20

## 2017-09-09 MED ORDER — ACETAMINOPHEN 325 MG PO TABS: 650.0000 mg | ORAL_TABLET | Freq: Once | ORAL | Status: DC

## 2017-09-09 MED ORDER — TRANEXAMIC ACID 1000 MG/10ML IV SOLN
500.0000 mg | Freq: Once | INTRAVENOUS | Status: AC
  Administered 2017-09-09: 500 mg via TOPICAL
  Filled 2017-09-09: qty 10

## 2017-09-09 NOTE — Discharge Instructions (Signed)
Follow up with Dr. Benjamine Mola. Return to the ED if you develop new or worsening symptoms.

## 2017-09-09 NOTE — ED Triage Notes (Signed)
Pt st's she was seen here and treated for a nosebleed on 11/8.  St's the nasal tampon was removed earlier today and tonight nose started to bleed again from right nostril.  No active bleeding noted at this time

## 2017-09-09 NOTE — ED Notes (Signed)
ED Provider at bedside. 

## 2017-09-09 NOTE — ED Provider Notes (Signed)
[name]   CSN: [id number] Arrival date & time: 09/08/17  2310     History   Chief Complaint Chief Complaint  Patient presents with  . Epistaxis    HPI [name] is a 83 y.o. female.  Patient with history of recurrent nosebleeds.  States she was seen here and treated for a nosebleed on November 8 with cauterization and packing.  The packing was removed yesterday afternoon by Dr. Benjamine Mola. the right side of the nose started bleeding again last night.  Patient held pressure and bleeding subsided after about 2 hours.  She has been waiting in the waiting room since midnight.  She denies any dizziness or lightheadedness.  No chest pain or shortness of breath.  She does not take any blood thinners.   The history is provided by the patient.  Epistaxis      Past Medical History:  Diagnosis Date  . Allergy   . Bleeding nose   . Hyperlipidemia   . Hypertension   . Thyroid cancer [facility])     Patient Active Problem List   Diagnosis Date Noted  . Thyroid cancer, multifocal papillary thryroid cancer 06/18/2011    Past Surgical History:  Procedure Laterality Date  . ABDOMINAL HYSTERECTOMY     partial  . APPENDECTOMY    . BACK SURGERY     rod and screws  . BREAST SURGERY     augmentation  . CARDIAC CATHETERIZATION    . CATARACT EXTRACTION     both  . EYE SURGERY     both  . LAMINECTOMY    . NASAL SEPTUM SURGERY    . OOPHORECTOMY    . SHOULDER SURGERY     left and right  . SINUS IRRIGATION    . SUBACROMIAL DECOMPRESSION    . THYROIDECTOMY  2012    OB History    No data available       Home Medications    Prior to Admission medications   Medication Sig Start Date End Date Taking? Authorizing Provider  aspirin EC 81 MG tablet Take 81 mg by mouth daily.    [provider]  bisoprolol (ZEBETA) 10 MG tablet Take 5 mg by mouth 2 (two) times daily. 10/12/16   [provider]  calcium  carbonate (OS-CAL) 600 MG TABS Take 600 mg by mouth daily with lunch.     [provider]  docusate sodium (COLACE) 100 MG capsule Take 200 mg by mouth at bedtime.    [provider]  Polyethyl Glycol-Propyl Glycol (SYSTANE OP) Place 1 drop into both eyes 4 (four) times daily as needed (dry eyes).    [provider]  RESTASIS 0.05 % ophthalmic emulsion Place 1 drop into both eyes every 12 (twelve) hours.  01/17/12   [provider]  sodium chloride (OCEAN) 0.65 % nasal spray Place 1 spray into the nose daily as needed for congestion. Nasal congestion     [provider]  SYNTHROID 75 MCG tablet Take 75 mcg by mouth daily. 09/12/16   [provider]  valsartan-hydrochlorothiazide (DIOVAN-HCT) 160-25 MG per tablet Take 1 tablet by mouth daily.      [provider]    Family History Family History  Problem Relation Age of Onset  . Cancer Mother        breast  . Stroke Mother   . Heart disease Father   . Cancer Father  lung    Social History Social History   Tobacco Use  . Smoking status: Never Smoker  . Smokeless tobacco: Never Used  Substance Use Topics  . Alcohol use: Yes    Comment: Occasionally  . Drug use: No     Allergies   Iodinated diagnostic agents; Bextra [valdecoxib]; Ceclor [cefaclor]; Celebrex [celecoxib]; Codeine; Demerol; Doxycycline; Erythromycin; Meloxicam; Morphine and related; Penicillins; Scopolamine; Sulfur; Tramadol; Vicodin [hydrocodone-acetaminophen]; Vioxx [rofecoxib]; and Quinine derivatives   Review of Systems Review of Systems  Constitutional: Negative for activity change and appetite change.  HENT: Positive for nosebleeds. Negative for congestion.   Respiratory: Negative for cough, chest tightness and shortness of breath.   Cardiovascular: Negative for chest pain.  Gastrointestinal: Negative for abdominal pain, nausea and vomiting.  Genitourinary: Negative for dysuria and  hematuria.  Musculoskeletal: Negative for arthralgias, back pain and myalgias.  Skin: Negative for wound.  Neurological: Negative for dizziness, weakness and light-headedness.   all other systems are negative except as noted in the HPI and PMH.     Physical Exam Updated Vital Signs BP (!) 149/73 (BP Location: Right Arm)   Pulse (!) 54   Temp 97.8 F (36.6 C) (Oral)   Resp 16   Ht 5\' 2"  (1.575 m)   Wt 66.2 kg (146 lb)   SpO2 98%   BMI 26.70 kg/m   Physical Exam  Constitutional: She is oriented to person, place, and time. She appears well-developed and well-nourished. No distress.  HENT:  Head: Normocephalic and atraumatic.  Mouth/Throat: Oropharynx is clear and moist. No oropharyngeal exudate.  Dried blood in R nare. L nare normal No active bleeding.  No blood in posterior pharynx.  Eyes: Conjunctivae and EOM are normal. Pupils are equal, round, and reactive to light.  Neck: Normal range of motion. Neck supple.  No meningismus.  Cardiovascular: Normal rate, regular rhythm, normal heart sounds and intact distal pulses.  No murmur heard. Pulmonary/Chest: Effort normal and breath sounds normal. No respiratory distress.  Abdominal: Soft. There is no tenderness. There is no rebound and no guarding.  Musculoskeletal: Normal range of motion. She exhibits no edema or tenderness.  Neurological: She is alert and oriented to person, place, and time. No cranial nerve deficit. She exhibits normal muscle tone. Coordination normal.   5/5 strength throughout. CN 2-12 intact.Equal grip strength.   Skin: Skin is warm.  Psychiatric: She has a normal mood and affect. Her behavior is normal.  Nursing Reeves and vitals reviewed.    ED Treatments / Results  Labs (all labs ordered are listed, but only abnormal results are displayed) Labs Reviewed  CBC WITH DIFFERENTIAL/PLATELET    EKG  EKG Interpretation None       Radiology No results found.  Procedures .Epistaxis  Management Date/Time: 09/09/2017 7:00 AM Performed by: Ezequiel Essex, MD Authorized by: Ezequiel Essex, MD   Consent:    Consent obtained:  Verbal   Consent given by:  Patient   Risks discussed:  Bleeding and pain Anesthesia (see MAR for exact dosages):    Anesthesia method:  Topical application   Topical anesthetic:  Epinephrine and lidocaine gel Procedure details:    Treatment site:  R anterior   Treatment method:  Anterior pack and nasal tampon   Treatment complexity:  Extensive   Treatment episode: recurring   Post-procedure details:    Assessment:  Bleeding decreased   Patient tolerance of procedure:  Tolerated well, no immediate complications   (including critical care time)  Medications Ordered in  ED Medications - No data to display   Initial Impression / Assessment and Plan / ED Course  I have reviewed the triage vital signs and the nursing notes.  Pertinent labs & imaging results that were available during my care of the patient were reviewed by me and considered in my medical decision making (see chart for details).    Recurrent nose bleed after packing removed today. Bleeding was initially controlled but started actively again after clot was dislodged while patient was in exam room.  Bleeding controlled with epinephrine, lidocaine, afrin, and nasal packing.  No blood in posterior pharynx. Patient monitored without recurrence of bleeding.  Followup with Dr. Benjamine Mola. Return precautions discussed.  Final Clinical Impressions(s) / ED Diagnoses   Final diagnoses:  Right-sided epistaxis    ED Discharge Orders    None       Dalal Livengood, Annie Main, MD 09/09/17 [phone]

## 2019-01-13 ENCOUNTER — Emergency Department (HOSPITAL_COMMUNITY): Payer: Medicare Other

## 2019-01-13 ENCOUNTER — Other Ambulatory Visit: Payer: Self-pay

## 2019-01-13 ENCOUNTER — Encounter (HOSPITAL_COMMUNITY): Payer: Self-pay

## 2019-01-13 ENCOUNTER — Emergency Department (HOSPITAL_COMMUNITY)
Admission: EM | Admit: 2019-01-13 | Discharge: 2019-01-13 | Disposition: A | Discharge status: Home / Self Care | Payer: Medicare Other | Attending: Emergency Medicine | Admitting: Emergency Medicine

## 2019-01-13 DIAGNOSIS — J069 Acute upper respiratory infection, unspecified: Secondary | ICD-10-CM | POA: Diagnosis not present

## 2019-01-13 DIAGNOSIS — I10 Essential (primary) hypertension: Secondary | ICD-10-CM | POA: Diagnosis not present

## 2019-01-13 DIAGNOSIS — Z79899 Other long term (current) drug therapy: Secondary | ICD-10-CM | POA: Insufficient documentation

## 2019-01-13 DIAGNOSIS — R05 Cough: Secondary | ICD-10-CM | POA: Diagnosis present

## 2019-01-13 LAB — CBC WITH DIFFERENTIAL/PLATELET
ABS IMMATURE GRANULOCYTES: 0.02 10*3/uL (ref 0.00–0.07)
BASOS ABS: 0 10*3/uL (ref 0.0–0.1)
Basophils Relative: 0 %
Eosinophils Absolute: 0 10*3/uL (ref 0.0–0.5)
Eosinophils Relative: 0 %
HCT: 37.1 % (ref 36.0–46.0)
HEMOGLOBIN: 12.2 g/dL (ref 12.0–15.0)
IMMATURE GRANULOCYTES: 0 %
LYMPHS PCT: 13 %
Lymphs Abs: 0.9 10*3/uL (ref 0.7–4.0)
MCH: 29 pg (ref 26.0–34.0)
MCHC: 32.9 g/dL (ref 30.0–36.0)
MCV: 88.3 fL (ref 80.0–100.0)
Monocytes Absolute: 0.9 10*3/uL (ref 0.1–1.0)
Monocytes Relative: 14 %
NEUTROS ABS: 4.9 10*3/uL (ref 1.7–7.7)
NEUTROS PCT: 73 %
NRBC: 0 % (ref 0.0–0.2)
Platelets: 211 10*3/uL (ref 150–400)
RBC: 4.2 MIL/uL (ref 3.87–5.11)
RDW: 12.6 % (ref 11.5–15.5)
WBC: 6.8 10*3/uL (ref 4.0–10.5)

## 2019-01-13 LAB — BASIC METABOLIC PANEL
Anion gap: 11 (ref 5–15)
BUN: 15 mg/dL (ref 8–23)
CHLORIDE: 98 mmol/L (ref 98–111)
CO2: 25 mmol/L (ref 22–32)
Calcium: 9 mg/dL (ref 8.9–10.3)
Creatinine, Ser: 0.86 mg/dL (ref 0.44–1.00)
GFR calc non Af Amer: >60 mL/min (ref >60)
Glucose, Bld: 100 mg/dL — ABNORMAL HIGH (ref 70–99)
POTASSIUM: 3.6 mmol/L (ref 3.5–5.1)
SODIUM: 134 mmol/L — AB (ref 135–145)

## 2019-01-13 LAB — TROPONIN I
TROPONIN I: 0.06 ng/mL — AB (ref <0.03)
Troponin I: <0.03 ng/mL (ref <0.03)

## 2019-01-13 LAB — INFLUENZA PANEL BY PCR (TYPE A & B)
INFLAPCR: NEGATIVE
INFLBPCR: NEGATIVE

## 2019-01-13 MED ORDER — FLUTICASONE PROPIONATE 50 MCG/ACT NA SUSP: 1.0000 | Freq: Every day | NASAL | 2 refills | Status: DC

## 2019-01-13 MED ORDER — ACETAMINOPHEN 325 MG PO TABS: 650.0000 mg | ORAL_TABLET | Freq: Four times a day (QID) | ORAL | 0 refills | Status: AC | PRN

## 2019-01-13 MED ORDER — ACETAMINOPHEN 325 MG PO TABS
650.0000 mg | ORAL_TABLET | Freq: Once | ORAL | Status: AC
  Administered 2019-01-13: 650 mg via ORAL
  Filled 2019-01-13: qty 2

## 2019-01-13 MED ORDER — BENZONATATE 100 MG PO CAPS: 200.0000 mg | ORAL_CAPSULE | Freq: Three times a day (TID) | ORAL | 0 refills | Status: DC

## 2019-01-13 NOTE — ED Triage Notes (Signed)
Patient c/o a productive cough with yellow sputum x 2-3 days and a fever 101.4 at home yesterday.

## 2019-01-13 NOTE — Discharge Instructions (Addendum)
Take the [facility] [employer] and Tylenol as needed to help with your body aches and cough. Follow-up with your primary care provider. Return to the ED if you start to have worsening symptoms, develop chest pain, leg swelling, vomiting or coughing up blood.

## 2019-01-13 NOTE — ED Provider Notes (Signed)
[facility] DEPT Provider Note   CSN: [id number] Arrival date & time: 01/13/19  5102    History   Chief Complaint Chief Complaint  Patient presents with   Cough   Shortness of Breath   Fever    HPI [name] is a 85 y.o. female with a past medical history of hypertension, thyroid cancer status post thyroidectomy >5 years ago, presents to ED for 3-day history of cough productive with yellow sputum, fever with T-max 101.4, shortness of breath, rhinorrhea, body aches.  Sick contacts including her husband that had similar cough and allergy symptoms.  She took 1 dose of Tylenol yesterday before bed but has not taken any antipyretics today.  She denies any recent travel.  Denies any chest pain, hemoptysis, leg swelling. She did receive her influenza vaccine this year.     HPI  Past Medical History:  Diagnosis Date   Allergy    Bleeding nose    Hyperlipidemia    Hypertension    Thyroid cancer [facility])     Patient Active Problem List   Diagnosis Date Noted   Thyroid cancer, multifocal papillary thryroid cancer 06/18/2011    Past Surgical History:  Procedure Laterality Date   ABDOMINAL HYSTERECTOMY     partial   APPENDECTOMY     BACK SURGERY     rod and screws   BREAST SURGERY     augmentation   CARDIAC CATHETERIZATION     CATARACT EXTRACTION     both   EYE SURGERY     both   LAMINECTOMY     NASAL SEPTUM SURGERY     OOPHORECTOMY     SHOULDER SURGERY     left and right   SINUS IRRIGATION     SUBACROMIAL DECOMPRESSION     THYROIDECTOMY  2012     OB History   No obstetric history on file.      Home Medications    Prior to Admission medications   Medication Sig Start Date End Date Taking? Authorizing Provider  alendronate (FOSAMAX) 10 MG tablet Take 10 mg by mouth daily. 12/24/18  Yes [provider]  bisoprolol (ZEBETA) 10 MG tablet Take 5 mg by mouth 2 (two) times daily. 10/12/16  Yes [provider]  calcium carbonate (OS-CAL) 600 MG TABS Take 600 mg by mouth daily.    Yes [provider]  docusate sodium (COLACE) 100 MG capsule Take 200 mg by mouth at bedtime.   Yes [provider]  gentamicin ointment (GARAMYCIN) 0.1 % Apply 1 application topically 3 (three) times daily. 01/08/19  Yes [provider]  Polyethyl Glycol-Propyl Glycol (SYSTANE OP) Place 1 drop into both eyes 4 (four) times daily as needed (dry eyes).   Yes [provider]  RESTASIS 0.05 % ophthalmic emulsion Place 1 drop into both eyes every 12 (twelve) hours.  01/17/12  Yes [provider]  sodium chloride (OCEAN) 0.65 % nasal spray Place 1 spray into the nose daily as needed for congestion.    Yes [provider]  SYNTHROID 75 MCG tablet Take 75 mcg by mouth daily. 09/12/16  Yes [provider]  valsartan-hydrochlorothiazide (DIOVAN-HCT) 160-25 MG per tablet Take 1 tablet by mouth daily.     Yes [provider]  acetaminophen (TYLENOL) 325 MG tablet Take 2 tablets (650 mg total) by mouth every 6 (six) hours as needed. 01/13/19   Shaiden Aldous, PA-C  benzonatate (TESSALON) 100 MG capsule Take 2 capsules (200  mg total) by mouth every 8 (eight) hours. 01/13/19   Justinian Miano, PA-C  fluticasone (FLONASE) 50 MCG/ACT nasal spray Place 1 spray into both nostrils daily. 01/13/19   Delia Heady, PA-C    Family History Family History  Problem Relation Age of Onset   Cancer Mother        breast   Stroke Mother    Heart disease Father    Cancer Father        lung    Social History Social History   Tobacco Use   Smoking status: Never Smoker   Smokeless tobacco: Never Used  Substance Use Topics   Alcohol use: Yes    Comment: Occasionally   Drug use: No     Allergies   Iodinated diagnostic agents; Bextra [valdecoxib]; Ceclor [cefaclor]; Celebrex [celecoxib]; Codeine; Demerol; Doxycycline; Erythromycin; Meloxicam; Morphine and  related; Penicillins; Scopolamine; Sulfur; Tramadol; Vicodin [hydrocodone-acetaminophen]; Vioxx [rofecoxib]; and Quinine derivatives   Review of Systems Review of Systems  Constitutional: Positive for chills and fever. Negative for appetite change.  HENT: Positive for congestion, rhinorrhea and sinus pressure. Negative for ear pain, sneezing and sore throat.   Eyes: Negative for photophobia and visual disturbance.  Respiratory: Positive for cough and shortness of breath. Negative for chest tightness and wheezing.   Cardiovascular: Negative for chest pain and palpitations.  Gastrointestinal: Negative for abdominal pain, blood in stool, constipation, diarrhea, nausea and vomiting.  Genitourinary: Negative for dysuria, hematuria and urgency.  Musculoskeletal: Positive for myalgias.  Skin: Negative for rash.  Neurological: Negative for dizziness, weakness and light-headedness.     Physical Exam Updated Vital Signs BP (!) 126/58    Pulse 64    Temp 98.2 F (36.8 C) (Oral)    Resp 17    Ht 5' 1.5" (1.562 m)    Wt 64.4 kg    SpO2 93%    BMI 26.40 kg/m   Physical Exam Vitals signs and nursing note reviewed.  Constitutional:      General: She is not in acute distress.    Appearance: She is well-developed.  HENT:     Head: Normocephalic and atraumatic.     Right Ear: A middle ear effusion is present.     Left Ear: A middle ear effusion is present.     Nose:     Right Sinus: Frontal sinus tenderness present.     Left Sinus: Frontal sinus tenderness present.     Mouth/Throat:     Pharynx: Oropharynx is clear. Uvula midline.  Eyes:     General: No scleral icterus.       Left eye: No discharge.     Conjunctiva/sclera: Conjunctivae normal.  Neck:     Musculoskeletal: Normal range of motion and neck supple.  Cardiovascular:     Rate and Rhythm: Normal rate and regular rhythm.     Heart sounds: Normal heart sounds. No murmur. No friction rub. No gallop.   Pulmonary:     Effort:  Pulmonary effort is normal. No respiratory distress.     Breath sounds: Normal breath sounds.  Abdominal:     General: Bowel sounds are normal. There is no distension.     Palpations: Abdomen is soft.     Tenderness: There is no abdominal tenderness. There is no guarding.  Musculoskeletal: Normal range of motion.  Skin:    General: Skin is warm and dry.     Findings: No rash.  Neurological:     Mental Status: She is alert.  Motor: No abnormal muscle tone.     Coordination: Coordination normal.      ED Treatments / Results  Labs (all labs ordered are listed, but only abnormal results are displayed) Labs Reviewed  BASIC METABOLIC PANEL - Abnormal; Notable for the following components:      Result Value   Sodium 134 (*)    Glucose, Bld 100 (*)    All other components within normal limits  TROPONIN I - Abnormal; Notable for the following components:   Troponin I 0.06 (*)    All other components within normal limits  CBC WITH DIFFERENTIAL/PLATELET  INFLUENZA PANEL BY PCR (TYPE A & B)  TROPONIN I    EKG EKG Interpretation  Date/Time:  Thursday January 13 2019 10:54:27 EDT Ventricular Rate:  70 PR Interval:    QRS Duration: 149 QT Interval:  446 QTC Calculation: 482 R Axis:   -22 Text Interpretation:  Sinus rhythm Left bundle branch block Since last tracing Left bundle branch block is new Confirmed by Daleen Bo [id number]) on 01/13/2019 10:57:57 AM Also confirmed by Daleen Bo [id number]), editor Philomena Doheny [phone])  on 01/13/2019 12:58:31 PM Also confirmed by Davonna Belling [phone])  on 01/13/2019 3:06:44 PM   Radiology Dg Chest 2 View  Result Date: 01/13/2019 CLINICAL DATA:  Cough and fever for several days. EXAM: CHEST - 2 VIEW COMPARISON:  06/30/2010 chest radiograph FINDINGS: The cardiomediastinal silhouette is unremarkable. Elevated RIGHT hemidiaphragm again noted. There is no evidence of focal airspace disease, pulmonary edema, suspicious pulmonary nodule/mass,  pleural effusion, or pneumothorax. No acute bony abnormalities are identified. IMPRESSION: No active cardiopulmonary disease. Electronically Signed   By: Margarette Canada M.D.   On: 01/13/2019 10:29    Procedures Procedures (including critical care time)  Medications Ordered in ED Medications  acetaminophen (TYLENOL) tablet 650 mg (650 mg Oral Given 01/13/19 1237)     Initial Impression / Assessment and Plan / ED Course  I have reviewed the triage vital signs and the nursing notes.  Pertinent labs & imaging results that were available during my care of the patient were reviewed by me and considered in my medical decision making (see chart for details).        85 year old female with a past medical history of hypertension, thyroid cancer status post thyroidectomy greater than 5 years ago, presents to ED for 3-day history of cough productive with yellow sputum, fever with T-max one 1.4, shortness of breath, rhinorrhea and body aches.  Sick contacts including her husband with similar cough and allergy symptoms.  She has not taken any antipyretics today.  Denies any recent travel or being in close contact with anyone who tested positive for covid19.  She did receive her influenza vaccine this year.  On my exam lungs are clear to auscultation bilaterally.  No lower extremity edema, erythema or calf tenderness noted concerning for DVT.  CBC, BMP unremarkable.  Flu swab is negative.  Troponin elevated at 0.06.  EKG shows new LBBB however last EKG was from 19yrs ago. Patient continues to deny chest pain and is resting comfortably.  Will recheck troponin after 3hrs and reassess. Chart review shows she underwent cardiac cath but patient unable to recall, nor am I able to find results of this or a prior troponin for comparison.  Repeat troponin after 3 hours is negative.  Unsure first troponin was elevated by lab error.  She continues to be chest pain-free, resting comfortably with vital signs within normal  limits.  I do  not see any indication for covid19 testing at this time as she has not had any known exposures and is well enough for discharge home. She is requesting discharge home.  Will give Tessalon Perles and Tylenol as well as PCP follow-up for what appears to be viral URI.  We will have her return to ED for any severe worsening symptoms. Patient discussed with and seen by my attending, Dr. Alvino Chapel.  Patient is hemodynamically stable, in NAD, and able to ambulate in the ED. Evaluation does not show pathology that would require ongoing emergent intervention or inpatient treatment. I explained the diagnosis to the patient. Pain has been managed and has no complaints prior to discharge. Patient is comfortable with above plan and is stable for discharge at this time. All questions were answered prior to disposition. Strict return precautions for returning to the ED were discussed. Encouraged follow up with PCP.    Portions of this note were generated with Lobbyist. Dictation errors may occur despite best attempts at proofreading.  Final Clinical Impressions(s) / ED Diagnoses   Final diagnoses:  Viral upper respiratory tract infection    ED Discharge Orders         Ordered    benzonatate (TESSALON) 100 MG capsule  Every 8 hours     01/13/19 1512    acetaminophen (TYLENOL) 325 MG tablet  Every 6 hours PRN     01/13/19 1512    fluticasone (FLONASE) 50 MCG/ACT nasal spray  Daily     01/13/19 [place], Marli Diego, PA-C 01/13/19 1517    Davonna Belling, MD 01/13/19 [phone]

## 2019-01-13 NOTE — ED Notes (Signed)
Pt voided using bedpan.

## 2019-12-21 ENCOUNTER — Emergency Department (HOSPITAL_COMMUNITY)
Admission: EM | Admit: 2019-12-21 | Discharge: 2019-12-21 | Disposition: A | Discharge status: Home / Self Care | Payer: Medicare Other | Attending: Emergency Medicine | Admitting: Emergency Medicine

## 2019-12-21 ENCOUNTER — Other Ambulatory Visit: Payer: Self-pay

## 2019-12-21 ENCOUNTER — Emergency Department (HOSPITAL_COMMUNITY): Payer: Medicare Other

## 2019-12-21 ENCOUNTER — Encounter (HOSPITAL_COMMUNITY): Payer: Self-pay | Admitting: Emergency Medicine

## 2019-12-21 DIAGNOSIS — Y93E6 Activity, residential relocation: Secondary | ICD-10-CM | POA: Diagnosis not present

## 2019-12-21 DIAGNOSIS — Z85850 Personal history of malignant neoplasm of thyroid: Secondary | ICD-10-CM | POA: Diagnosis not present

## 2019-12-21 DIAGNOSIS — X500XXA Overexertion from strenuous movement or load, initial encounter: Secondary | ICD-10-CM | POA: Diagnosis not present

## 2019-12-21 DIAGNOSIS — S39012A Strain of muscle, fascia and tendon of lower back, initial encounter: Secondary | ICD-10-CM | POA: Diagnosis not present

## 2019-12-21 DIAGNOSIS — Y92009 Unspecified place in unspecified non-institutional (private) residence as the place of occurrence of the external cause: Secondary | ICD-10-CM | POA: Diagnosis not present

## 2019-12-21 DIAGNOSIS — I10 Essential (primary) hypertension: Secondary | ICD-10-CM | POA: Insufficient documentation

## 2019-12-21 DIAGNOSIS — Y998 Other external cause status: Secondary | ICD-10-CM | POA: Insufficient documentation

## 2019-12-21 DIAGNOSIS — Z79899 Other long term (current) drug therapy: Secondary | ICD-10-CM | POA: Diagnosis not present

## 2019-12-21 DIAGNOSIS — S3992XA Unspecified injury of lower back, initial encounter: Secondary | ICD-10-CM | POA: Diagnosis present

## 2019-12-21 LAB — CBC WITH DIFFERENTIAL/PLATELET
Abs Immature Granulocytes: 0.02 10*3/uL (ref 0.00–0.07)
Basophils Absolute: 0 10*3/uL (ref 0.0–0.1)
Basophils Relative: 0 %
Eosinophils Absolute: 0 10*3/uL (ref 0.0–0.5)
Eosinophils Relative: 0 %
HCT: 36.6 % (ref 36.0–46.0)
Hemoglobin: 12.4 g/dL (ref 12.0–15.0)
Immature Granulocytes: 0 %
Lymphocytes Relative: 28 %
Lymphs Abs: 2 10*3/uL (ref 0.7–4.0)
MCH: 29.1 pg (ref 26.0–34.0)
MCHC: 33.9 g/dL (ref 30.0–36.0)
MCV: 85.9 fL (ref 80.0–100.0)
Monocytes Absolute: 0.6 10*3/uL (ref 0.1–1.0)
Monocytes Relative: 9 %
Neutro Abs: 4.5 10*3/uL (ref 1.7–7.7)
Neutrophils Relative %: 63 %
Platelets: 262 10*3/uL (ref 150–400)
RBC: 4.26 MIL/uL (ref 3.87–5.11)
RDW: 12.8 % (ref 11.5–15.5)
WBC: 7.2 10*3/uL (ref 4.0–10.5)
nRBC: 0 % (ref 0.0–0.2)

## 2019-12-21 LAB — COMPREHENSIVE METABOLIC PANEL
ALT: 18 U/L (ref 0–44)
AST: 25 U/L (ref 15–41)
Albumin: 4 g/dL (ref 3.5–5.0)
Alkaline Phosphatase: 45 U/L (ref 38–126)
Anion gap: 10 (ref 5–15)
BUN: 20 mg/dL (ref 8–23)
CO2: 24 mmol/L (ref 22–32)
Calcium: 9.2 mg/dL (ref 8.9–10.3)
Chloride: 104 mmol/L (ref 98–111)
Creatinine, Ser: 0.97 mg/dL (ref 0.44–1.00)
GFR calc Af Amer: >60 mL/min (ref >60)
GFR calc non Af Amer: 53 mL/min — ABNORMAL LOW (ref >60)
Glucose, Bld: 101 mg/dL — ABNORMAL HIGH (ref 70–99)
Potassium: 3.8 mmol/L (ref 3.5–5.1)
Sodium: 138 mmol/L (ref 135–145)
Total Bilirubin: 0.7 mg/dL (ref 0.3–1.2)
Total Protein: 7 g/dL (ref 6.5–8.1)

## 2019-12-21 LAB — ACETAMINOPHEN LEVEL: Acetaminophen (Tylenol), Serum: <10 ug/mL — ABNORMAL LOW (ref 10–30)

## 2019-12-21 MED ORDER — DICLOFENAC SODIUM 1 % EX GEL: 2.0000 g | Freq: Four times a day (QID) | CUTANEOUS | 0 refills | Status: DC

## 2019-12-21 MED ORDER — TIZANIDINE HCL 2 MG PO CAPS: 2.0000 mg | ORAL_CAPSULE | Freq: Three times a day (TID) | ORAL | 0 refills | Status: DC

## 2019-12-21 NOTE — Discharge Instructions (Signed)
Take the medications as prescribed.  You can take Tylenol and ibuprofen as well. Return to the ED if you start to develop worsening back pain, chest pain, shortness of breath, injuries or falls, numbness in arms or legs.

## 2019-12-21 NOTE — ED Triage Notes (Signed)
Pt reports that she is having to pack up her house due to them moving and was in the attic on Sunday with someone getting stuff out. Reports having mid to lower back pains and itching on her back.

## 2019-12-21 NOTE — ED Provider Notes (Signed)
[facility] DEPT Provider Note   CSN: [id number] Arrival date & time: 12/21/19  [id number]     History Chief Complaint  Patient presents with  . Back Pain  . Pruritis    [name] is a 86 y.o. female with a past medical history of hypertension, hyperlipidemia presenting to the ED with a chief complaint of back pain.  For the past 3 days been having aching mid back pain that is worse with movement.  She does admit to increased lifting and movement over the weekend when she was packing up her belongings in her house.  She has been taking Tylenol with minimal improvement in her symptoms but she is unsure if she "took too much of it, but I was trying to be careful."  Yesterday she had 3 episodes of nonbloody, nonbilious emesis and diarrhea.  Symptoms resolved today.  She continues to have pain in her back as well as itching throughout her entire back.  She states that "I think I just strained all the muscles in my back and now they're trying to heal and they're just itching."  Denies any chest pain, shortness of breath, numbness in arms or legs, abdominal pain, leg swelling, history of DVT, PE, MI, injuries or falls.  She did have a prior surgery on her back over 10 years ago and "they put a rod in there."  HPI     Past Medical History:  Diagnosis Date  . Allergy   . Bleeding nose   . Hyperlipidemia   . Hypertension   . Thyroid cancer [facility])     Patient Active Problem List   Diagnosis Date Noted  . Thyroid cancer, multifocal papillary thryroid cancer 06/18/2011    Past Surgical History:  Procedure Laterality Date  . ABDOMINAL HYSTERECTOMY     partial  . APPENDECTOMY    . BACK SURGERY     rod and screws  . BREAST SURGERY     augmentation  . CARDIAC CATHETERIZATION    . CATARACT EXTRACTION     both  . EYE SURGERY     both  . LAMINECTOMY    . NASAL SEPTUM SURGERY    . OOPHORECTOMY    . SHOULDER SURGERY     left and right  . SINUS IRRIGATION      . SUBACROMIAL DECOMPRESSION    . THYROIDECTOMY  2012     OB History   No obstetric history on file.     Family History  Problem Relation Age of Onset  . Cancer Mother        breast  . Stroke Mother   . Heart disease Father   . Cancer Father        lung    Social History   Tobacco Use  . Smoking status: Never Smoker  . Smokeless tobacco: Never Used  Substance Use Topics  . Alcohol use: Yes    Comment: Occasionally  . Drug use: No    Home Medications Prior to Admission medications   Medication Sig Start Date End Date Taking? Authorizing Provider  acetaminophen (TYLENOL) 325 MG tablet Take 2 tablets (650 mg total) by mouth every 6 (six) hours as needed. 01/13/19  Yes Cortland Crehan, PA-C  bisoprolol (ZEBETA) 10 MG tablet Take 5 mg by mouth 2 (two) times daily. 10/12/16  Yes [provider]  RESTASIS 0.05 % ophthalmic emulsion Place 1 drop into both eyes every 12 (twelve) hours.  01/17/12  Yes [provider]  SYNTHROID 75 MCG tablet Take 75 mcg by mouth daily. 09/12/16  Yes [provider]  valsartan-hydrochlorothiazide (DIOVAN-HCT) 160-25 MG per tablet Take 1 tablet by mouth daily.     Yes [provider]  benzonatate (TESSALON) 100 MG capsule Take 2 capsules (200 mg total) by mouth every 8 (eight) hours. Patient not taking: Reported on 12/21/2019 01/13/19   Delia Heady, PA-C  diclofenac Sodium (VOLTAREN) 1 % GEL Apply 2 g topically 4 (four) times daily. 12/21/19   Seleste Tallman, PA-C  fluticasone (FLONASE) 50 MCG/ACT nasal spray Place 1 spray into both nostrils daily. Patient not taking: Reported on 12/21/2019 01/13/19   Delia Heady, PA-C  Polyethyl Glycol-Propyl Glycol (SYSTANE OP) Place 1 drop into both eyes 4 (four) times daily as needed (dry eyes).    [provider]  tizanidine (ZANAFLEX) 2 MG capsule Take 1 capsule (2 mg total) by mouth 3 (three) times daily. 12/21/19   Christine Morton, PA-C    Allergies    Iodinated diagnostic  agents, Bextra [valdecoxib], Ceclor [cefaclor], Celebrex [celecoxib], Codeine, Demerol, Doxycycline, Erythromycin, Meloxicam, Morphine and related, Penicillins, Scopolamine, Sulfur, Tramadol, Vicodin [hydrocodone-acetaminophen], Vioxx [rofecoxib], and Quinine derivatives  Review of Systems   Review of Systems  Constitutional: Negative for appetite change, chills and fever.  HENT: Negative for ear pain, rhinorrhea, sneezing and sore throat.   Eyes: Negative for photophobia and visual disturbance.  Respiratory: Negative for cough, chest tightness, shortness of breath and wheezing.   Cardiovascular: Negative for chest pain and palpitations.  Gastrointestinal: Positive for diarrhea, nausea and vomiting. Negative for abdominal pain, blood in stool and constipation.  Genitourinary: Negative for dysuria, hematuria and urgency.  Musculoskeletal: Positive for back pain. Negative for myalgias.  Skin: Negative for rash.  Neurological: Negative for dizziness, weakness and light-headedness.    Physical Exam Updated Vital Signs BP (!) 146/94 (BP Location: Left Arm)   Pulse 65   Temp 98.3 F (36.8 C) (Oral)   Resp 16   SpO2 100%   Physical Exam Vitals and nursing note reviewed.  Constitutional:      General: She is not in acute distress.    Appearance: She is well-developed.  HENT:     Head: Normocephalic and atraumatic.     Nose: Nose normal.  Eyes:     General: No scleral icterus.       Left eye: No discharge.     Conjunctiva/sclera: Conjunctivae normal.  Cardiovascular:     Rate and Rhythm: Normal rate and regular rhythm.     Heart sounds: Normal heart sounds. No murmur. No friction rub. No gallop.   Pulmonary:     Effort: Pulmonary effort is normal. No respiratory distress.     Breath sounds: Normal breath sounds.  Abdominal:     General: Bowel sounds are normal. There is no distension.     Palpations: Abdomen is soft.     Tenderness: There is no abdominal tenderness. There is no  guarding.  Musculoskeletal:        General: Normal range of motion.     Cervical back: Normal range of motion and neck supple.     Comments: No midline spinal tenderness present in lumbar, thoracic or cervical spine. No step-off palpated. No visible bruising, edema or temperature change noted. No objective signs of numbness present. No saddle anesthesia. 2+ DP pulses bilaterally. Sensation intact to light touch. Strength 5/5 in bilateral lower extremities.  Skin:    General: Skin is warm and dry.  Findings: No rash.     Comments: No rashes or lesions noted on examination of back.  Neurological:     Mental Status: She is alert.     Motor: No abnormal muscle tone.     Coordination: Coordination normal.     ED Results / Procedures / Treatments   Labs (all labs ordered are listed, but only abnormal results are displayed) Labs Reviewed  COMPREHENSIVE METABOLIC PANEL - Abnormal; Notable for the following components:      Result Value   Glucose, Bld 101 (*)    GFR calc non Af Amer 53 (*)    All other components within normal limits  ACETAMINOPHEN LEVEL - Abnormal; Notable for the following components:   Acetaminophen (Tylenol), Serum <10 (*)    All other components within normal limits  CBC WITH DIFFERENTIAL/PLATELET    EKG None  Radiology DG Lumbar Spine Complete  Result Date: 12/21/2019 CLINICAL DATA:  New onset of worsening mid to low back pain since lifting injury 3 days ago. EXAM: LUMBAR SPINE - COMPLETE 4+ VIEW COMPARISON:  Lumbar spine radiographs 06/30/2010. FINDINGS: There are 5 lumbar type vertebral bodies. The alignment is stable with a mild convex right scoliosis and a grade 1 anterolisthesis at L5-S1 status post PLIF. The hardware is intact. There is mildly progressive loss of disc height at L2-3. Mild facet degenerative changes are present inferiorly. No evidence of acute fracture or pars defect. Diffuse vascular calcifications are noted. IMPRESSION: Mildly progressive  disc space loss at L2-3. No acute osseous findings. Stable alignment status post L5-S1 PLIF. Electronically Signed   By: Richardean Sale M.D.   On: 12/21/2019 12:17    Procedures Procedures (including critical care time)  Medications Ordered in ED Medications - No data to display  ED Course  I have reviewed the triage vital signs and the nursing notes.  Pertinent labs & imaging results that were available during my care of the patient were reviewed by me and considered in my medical decision making (see chart for details).    MDM Rules/Calculators/A&P                      86 year old female presents to ED with a chief complaint of back pain.  States that she is moving this and spent the weekend packing and lifting boxes.  States that pain is worse with movement.  She has been taking Tylenol but is unsure if she is taking the right amount.  She also reports vomiting and diarrhea yesterday but no GI symptoms today.  On exam abdomen is soft, nontender nondistended.  Vital signs are within normal limits.  Reports pain in her back as well as itching in the area but there are no abnormalities on my exam. No rashes or lesions noted. The area of pain and pruritis crosses the midline.  CBC, CMP and Tylenol level today are unremarkable.  X-ray of the lower lumbar spine without any acute abnormalities.  Suspect the symptoms are musculoskeletal in nature. Doubt cauda equina, dissection, pyelonephritis or other emergent cause of symptoms.  Patient will be discharged home with muscle relaxer, Voltaren gel and continued over-the-counter pain medication.  Patient is hemodynamically stable, in NAD, and able to ambulate in the ED. Evaluation does not show pathology that would require ongoing emergent intervention or inpatient treatment. I have personally reviewed and interpreted all lab work and imaging at today's ED visit. I explained the diagnosis to the patient. Pain has been managed and has  no complaints prior  to discharge. Patient is comfortable with above plan and is stable for discharge at this time. All questions were answered prior to disposition. Strict return precautions for returning to the ED were discussed. Encouraged follow up with PCP.   An After Visit Summary was printed and given to the patient.   Portions of this note were generated with Lobbyist. Dictation errors may occur despite best attempts at proofreading.  Final Clinical Impression(s) / ED Diagnoses Final diagnoses:  Strain of lumbar region, initial encounter    Rx / DC Orders ED Discharge Orders         Ordered    tizanidine (ZANAFLEX) 2 MG capsule  3 times daily     12/21/19 1253    diclofenac Sodium (VOLTAREN) 1 % GEL  4 times daily     12/21/19 1253           Delia Heady, PA-C 12/21/19 [place], Ankit, MD 12/22/19 [phone]

## 2019-12-28 ENCOUNTER — Encounter (HOSPITAL_COMMUNITY): Payer: Self-pay

## 2019-12-28 ENCOUNTER — Ambulatory Visit (HOSPITAL_COMMUNITY)
Admission: EM | Admit: 2019-12-28 | Discharge: 2019-12-28 | Disposition: A | Discharge status: Home / Self Care | Payer: Medicare Other | Attending: Family Medicine | Admitting: Family Medicine

## 2019-12-28 ENCOUNTER — Other Ambulatory Visit: Payer: Self-pay

## 2019-12-28 DIAGNOSIS — R300 Dysuria: Secondary | ICD-10-CM | POA: Diagnosis present

## 2019-12-28 DIAGNOSIS — R350 Frequency of micturition: Secondary | ICD-10-CM | POA: Diagnosis present

## 2019-12-28 DIAGNOSIS — R109 Unspecified abdominal pain: Secondary | ICD-10-CM

## 2019-12-28 DIAGNOSIS — R3915 Urgency of urination: Secondary | ICD-10-CM | POA: Diagnosis present

## 2019-12-28 LAB — POCT URINALYSIS DIP (DEVICE)
Bilirubin Urine: NEGATIVE
Glucose, UA: NEGATIVE mg/dL
Ketones, ur: NEGATIVE mg/dL
Nitrite: NEGATIVE
Protein, ur: NEGATIVE mg/dL
Specific Gravity, Urine: 1.02 (ref 1.005–1.030)
Urobilinogen, UA: 0.2 mg/dL (ref 0.0–1.0)
pH: 7 (ref 5.0–8.0)

## 2019-12-28 MED ORDER — ONDANSETRON HCL 4 MG PO TABS: 4.0000 mg | ORAL_TABLET | Freq: Four times a day (QID) | ORAL | 0 refills | Status: DC

## 2019-12-28 MED ORDER — CEPHALEXIN 500 MG PO CAPS: 500.0000 mg | ORAL_CAPSULE | Freq: Two times a day (BID) | ORAL | 0 refills | Status: AC

## 2019-12-28 NOTE — ED Notes (Signed)
Urine placed in lab 

## 2019-12-28 NOTE — Discharge Instructions (Signed)
You may have a urinary tract infection. We are going to culture your urine and will call you as soon as we have the results.   Drink plenty of water, 8-10 glasses per day.   You may take AZO over the counter for painful urination.  Follow up with your primary care provider as needed.   Go to the Emergency Department if you experience severe pain, shortness of breath, high fever, or other concerns.   

## 2019-12-28 NOTE — ED Triage Notes (Signed)
Pt states she has left side flank kidney pain. [Pt states has some burning while voiding. X 2 days.

## 2019-12-28 NOTE — ED Provider Notes (Signed)
[place]    CSN: [id number] Arrival date & time: 12/28/19  1043      History   Chief Complaint Chief Complaint  Patient presents with  . Flank Pain    HPI [name] is a 86 y.o. female.   Patient reports dysuria x2 days.  Reports left-sided flank pain x2 days as well.  Reports history of UTIs.  Reports frequency, urgency, denies incontinence.  Denies fever, nausea, vomiting, diarrhea, headache, chills, body aches, rash, other symptoms.  ROS Per HPI  The history is provided by the patient.  Flank Pain    Past Medical History:  Diagnosis Date  . Allergy   . Bleeding nose   . Hyperlipidemia   . Hypertension   . Thyroid cancer [facility])     Patient Active Problem List   Diagnosis Date Noted  . Thyroid cancer, multifocal papillary thryroid cancer 06/18/2011    Past Surgical History:  Procedure Laterality Date  . ABDOMINAL HYSTERECTOMY     partial  . APPENDECTOMY    . BACK SURGERY     rod and screws  . BREAST SURGERY     augmentation  . CARDIAC CATHETERIZATION    . CATARACT EXTRACTION     both  . EYE SURGERY     both  . LAMINECTOMY    . NASAL SEPTUM SURGERY    . OOPHORECTOMY    . SHOULDER SURGERY     left and right  . SINUS IRRIGATION    . SUBACROMIAL DECOMPRESSION    . THYROIDECTOMY  2012    OB History   No obstetric history on file.      Home Medications    Prior to Admission medications   Medication Sig Start Date End Date Taking? Authorizing Provider  acetaminophen (TYLENOL) 325 MG tablet Take 2 tablets (650 mg total) by mouth every 6 (six) hours as needed. 01/13/19   Khatri, Hina, PA-C  benzonatate (TESSALON) 100 MG capsule Take 2 capsules (200 mg total) by mouth every 8 (eight) hours. Patient not taking: Reported on 12/21/2019 01/13/19   Delia Heady, PA-C  bisoprolol (ZEBETA) 10 MG tablet Take 5 mg by mouth 2 (two) times daily. 10/12/16   [provider]  cephALEXin (KEFLEX) 500 MG capsule Take 1 capsule (500 mg  total) by mouth 2 (two) times daily for 7 days. 12/28/19 01/04/20  Faustino Congress, NP  diclofenac Sodium (VOLTAREN) 1 % GEL Apply 2 g topically 4 (four) times daily. 12/21/19   Khatri, Hina, PA-C  fluticasone (FLONASE) 50 MCG/ACT nasal spray Place 1 spray into both nostrils daily. Patient not taking: Reported on 12/21/2019 01/13/19   Delia Heady, PA-C  ondansetron (ZOFRAN) 4 MG tablet Take 1 tablet (4 mg total) by mouth every 6 (six) hours. 12/28/19   Faustino Congress, NP  Polyethyl Glycol-Propyl Glycol (SYSTANE OP) Place 1 drop into both eyes 4 (four) times daily as needed (dry eyes).    [provider]  RESTASIS 0.05 % ophthalmic emulsion Place 1 drop into both eyes every 12 (twelve) hours.  01/17/12   [provider]  SYNTHROID 75 MCG tablet Take 75 mcg by mouth daily. 09/12/16   [provider]  tizanidine (ZANAFLEX) 2 MG capsule Take 1 capsule (2 mg total) by mouth 3 (three) times daily. 12/21/19   Khatri, Hina, PA-C  valsartan-hydrochlorothiazide (DIOVAN-HCT) 160-25 MG per tablet Take 1 tablet by mouth daily.      [provider]    Family History Family History  Problem Relation Age of Onset  . Cancer Mother        breast  . Stroke Mother   . Heart disease Father   . Cancer Father        lung    Social History Social History   Tobacco Use  . Smoking status: Never Smoker  . Smokeless tobacco: Never Used  Substance Use Topics  . Alcohol use: Yes    Comment: Occasionally  . Drug use: No     Allergies   Iodinated diagnostic agents, Bextra [valdecoxib], Ceclor [cefaclor], Celebrex [celecoxib], Codeine, Demerol, Doxycycline, Erythromycin, Meloxicam, Morphine and related, Penicillins, Scopolamine, Sulfur, Tramadol, Vicodin [hydrocodone-acetaminophen], Vioxx [rofecoxib], and Quinine derivatives   Review of Systems Review of Systems  Genitourinary: Positive for flank pain.     Physical Exam Triage Vital Signs ED Triage Vitals  Enc  Vitals Group     BP 12/28/19 1124 (!) 159/68     Pulse Rate 12/28/19 1124 60     Resp 12/28/19 1124 16     Temp 12/28/19 1124 98.2 F (36.8 C)     Temp Source 12/28/19 1124 Oral     SpO2 12/28/19 1124 98 %     Weight 12/28/19 1121 146 lb (66.2 kg)     Height --      Head Circumference --      Peak Flow --      Pain Score 12/28/19 1121 10     Pain Loc --      Pain Edu? --      Excl. in [place]? --    No data found.  Updated Vital Signs BP (!) 159/68 (BP Location: Right Arm)   Pulse 60   Temp 98.2 F (36.8 C) (Oral)   Resp 16   Wt 146 lb (66.2 kg)   SpO2 98%   BMI 27.14 kg/m     Physical Exam Vitals and nursing note reviewed.  Constitutional:      General: She is not in acute distress.    Appearance: Normal appearance. She is well-developed and normal weight. She is ill-appearing.  HENT:     Head: Normocephalic and atraumatic.  Eyes:     Conjunctiva/sclera: Conjunctivae normal.  Cardiovascular:     Rate and Rhythm: Normal rate and regular rhythm.     Pulses: Normal pulses.     Heart sounds: Normal heart sounds. No murmur.  Pulmonary:     Effort: Pulmonary effort is normal. No respiratory distress.     Breath sounds: Normal breath sounds. No stridor. No wheezing, rhonchi or rales.  Chest:     Chest wall: No tenderness.  Abdominal:     General: Bowel sounds are normal. There is no distension.     Palpations: Abdomen is soft. There is no mass.     Tenderness: There is abdominal tenderness. There is right CVA tenderness. There is no guarding or rebound.     Hernia: No hernia is present.     Comments: Suprapubic and right flank tenderness to palpation on exam  Musculoskeletal:        General: Normal range of motion.     Cervical back: Neck supple.  Skin:    General: Skin is warm and dry.     Capillary Refill: Capillary refill takes less than 2 seconds.  Neurological:     General: No focal deficit present.     Mental Status: She is alert and oriented to person,  place, and time.  Psychiatric:  Mood and Affect: Mood normal.        Behavior: Behavior normal.      UC Treatments / Results  Labs (all labs ordered are listed, but only abnormal results are displayed) Labs Reviewed  URINE CULTURE - Abnormal; Notable for the following components:      Result Value   Culture   (*)    Value: <10,000 COLONIES/mL INSIGNIFICANT GROWTH Performed at [facility], [place] [address], [place], [place] [address]    All other components within normal limits  POCT URINALYSIS DIP (DEVICE) - Abnormal; Notable for the following components:   Hgb urine dipstick TRACE (*)    Leukocytes,Ua TRACE (*)    All other components within normal limits    EKG   Radiology No results found.  Procedures Procedures (including critical care time)  Medications Ordered in UC Medications - No data to display  Initial Impression / Assessment and Plan / UC Course  I have reviewed the triage vital signs and the nursing notes.  Pertinent labs & imaging results that were available during my care of the patient were reviewed by me and considered in my medical decision making (see chart for details).  Clinical Course as of Dec 29 1419  Wed Dec 28, 2019  1149 POCT Urinalysis, Dipstick [SM]  1150 POCT Urinalysis, Dipstick [SM]    Clinical Course User Index [SM] Faustino Congress, NP   Presents with 2-day history of right flank pain, dysuria, urgency, frequency.  UA positive for trace blood and trace leukocytes.  Will culture and inform patient of the change in the need for treatment if needed.  Discussed with patient that this could also be a kidney stone.  Sent in Keflex 500 mg twice daily x7 days to treat your urinary tract infection.  Also sent in Zofran, in case this medication makes you nauseated.  Instructed to follow-up with primary care or this office as needed. Final Clinical Impressions(s) / UC Diagnoses   Final diagnoses:  Right flank pain  Dysuria   Urinary frequency  Urgency of micturition     Discharge Instructions     You may have a urinary tract infection. We are going to culture your urine and will call you as soon as we have the results.   Drink plenty of water, 8-10 glasses per day.   You may take AZO over the counter for painful urination.  Follow up with your primary care provider as needed.   Go to the Emergency Department if you experience severe pain, shortness of breath, high fever, or other concerns.      ED Prescriptions    Medication Sig Dispense Auth. Provider   cephALEXin (KEFLEX) 500 MG capsule Take 1 capsule (500 mg total) by mouth 2 (two) times daily for 7 days. 14 capsule Faustino Congress, NP   ondansetron (ZOFRAN) 4 MG tablet Take 1 tablet (4 mg total) by mouth every 6 (six) hours. 12 tablet Faustino Congress, NP     I have reviewed the PDMP during this encounter.   Faustino Congress, NP 12/29/19 1421

## 2019-12-29 LAB — URINE CULTURE: Culture: <10000 — AB

## 2019-12-30 ENCOUNTER — Encounter (HOSPITAL_COMMUNITY): Payer: Self-pay

## 2019-12-30 ENCOUNTER — Other Ambulatory Visit: Payer: Self-pay

## 2019-12-30 ENCOUNTER — Emergency Department (HOSPITAL_COMMUNITY): Payer: Medicare Other

## 2019-12-30 ENCOUNTER — Emergency Department (HOSPITAL_COMMUNITY)
Admission: EM | Admit: 2019-12-30 | Discharge: 2019-12-30 | Disposition: A | Discharge status: Home / Self Care | Payer: Medicare Other | Attending: Emergency Medicine | Admitting: Emergency Medicine

## 2019-12-30 DIAGNOSIS — Z85850 Personal history of malignant neoplasm of thyroid: Secondary | ICD-10-CM | POA: Insufficient documentation

## 2019-12-30 DIAGNOSIS — R109 Unspecified abdominal pain: Secondary | ICD-10-CM

## 2019-12-30 DIAGNOSIS — Z79899 Other long term (current) drug therapy: Secondary | ICD-10-CM | POA: Diagnosis not present

## 2019-12-30 DIAGNOSIS — K5900 Constipation, unspecified: Secondary | ICD-10-CM | POA: Diagnosis not present

## 2019-12-30 DIAGNOSIS — I10 Essential (primary) hypertension: Secondary | ICD-10-CM | POA: Diagnosis not present

## 2019-12-30 DIAGNOSIS — R1032 Left lower quadrant pain: Secondary | ICD-10-CM | POA: Diagnosis present

## 2019-12-30 LAB — CBC WITH DIFFERENTIAL/PLATELET
Abs Immature Granulocytes: 0.02 10*3/uL (ref 0.00–0.07)
Basophils Absolute: 0 10*3/uL (ref 0.0–0.1)
Basophils Relative: 1 %
Eosinophils Absolute: 0.2 10*3/uL (ref 0.0–0.5)
Eosinophils Relative: 2 %
HCT: 37.5 % (ref 36.0–46.0)
Hemoglobin: 12.6 g/dL (ref 12.0–15.0)
Immature Granulocytes: 0 %
Lymphocytes Relative: 35 %
Lymphs Abs: 2.5 10*3/uL (ref 0.7–4.0)
MCH: 29.2 pg (ref 26.0–34.0)
MCHC: 33.6 g/dL (ref 30.0–36.0)
MCV: 87 fL (ref 80.0–100.0)
Monocytes Absolute: 0.7 10*3/uL (ref 0.1–1.0)
Monocytes Relative: 10 %
Neutro Abs: 3.7 10*3/uL (ref 1.7–7.7)
Neutrophils Relative %: 52 %
Platelets: 293 10*3/uL (ref 150–400)
RBC: 4.31 MIL/uL (ref 3.87–5.11)
RDW: 13.2 % (ref 11.5–15.5)
WBC: 7 10*3/uL (ref 4.0–10.5)
nRBC: 0 % (ref 0.0–0.2)

## 2019-12-30 LAB — URINALYSIS, ROUTINE W REFLEX MICROSCOPIC
Bacteria, UA: NONE SEEN
Bilirubin Urine: NEGATIVE
Glucose, UA: NEGATIVE mg/dL
Hgb urine dipstick: NEGATIVE
Ketones, ur: NEGATIVE mg/dL
Nitrite: NEGATIVE
Protein, ur: NEGATIVE mg/dL
Specific Gravity, Urine: 1.006 (ref 1.005–1.030)
pH: 7 (ref 5.0–8.0)

## 2019-12-30 LAB — BASIC METABOLIC PANEL
Anion gap: 11 (ref 5–15)
BUN: 18 mg/dL (ref 8–23)
CO2: 24 mmol/L (ref 22–32)
Calcium: 8.8 mg/dL — ABNORMAL LOW (ref 8.9–10.3)
Chloride: 97 mmol/L — ABNORMAL LOW (ref 98–111)
Creatinine, Ser: 1.22 mg/dL — ABNORMAL HIGH (ref 0.44–1.00)
GFR calc Af Amer: 46 mL/min — ABNORMAL LOW (ref >60)
GFR calc non Af Amer: 40 mL/min — ABNORMAL LOW (ref >60)
Glucose, Bld: 89 mg/dL (ref 70–99)
Potassium: 4.6 mmol/L (ref 3.5–5.1)
Sodium: 132 mmol/L — ABNORMAL LOW (ref 135–145)

## 2019-12-30 MED ORDER — SODIUM CHLORIDE 0.9 % IV BOLUS
500.0000 mL | Freq: Once | INTRAVENOUS | Status: AC
  Administered 2019-12-30: 500 mL via INTRAVENOUS

## 2019-12-30 MED ORDER — LIDOCAINE 5 % EX PTCH: 1.0000 | MEDICATED_PATCH | CUTANEOUS | 0 refills | Status: DC

## 2019-12-30 MED ORDER — LIDOCAINE 5 % EX PTCH
1.0000 | MEDICATED_PATCH | CUTANEOUS | Status: DC
  Administered 2019-12-30: 12:00:00 1 via TRANSDERMAL
  Filled 2019-12-30: qty 1

## 2019-12-30 MED ORDER — ONDANSETRON HCL 4 MG/2ML IJ SOLN
4.0000 mg | Freq: Once | INTRAMUSCULAR | Status: AC
  Administered 2019-12-30: 4 mg via INTRAVENOUS
  Filled 2019-12-30: qty 2

## 2019-12-30 MED ORDER — FENTANYL CITRATE (PF) 100 MCG/2ML IJ SOLN
50.0000 ug | Freq: Once | INTRAMUSCULAR | Status: AC
  Administered 2019-12-30: 50 ug via INTRAVENOUS
  Filled 2019-12-30: qty 2

## 2019-12-30 NOTE — ED Triage Notes (Signed)
Patient c/o left flank pain x 3-4 days. Patient states she went to an UC 2 days ago and was prescribed an antibiotic for a UTI. Patient states she has a history of UTI's and feels like the antibiotic is not working.

## 2019-12-30 NOTE — Discharge Instructions (Addendum)
You were seen in the emergency department for left-sided flank pain.  You had blood work and a urinalysis that did not show any significant abnormalities.  You had a CAT scan of your abdomen and pelvis that did not show any signs of kidney stone or other findings in the left flank.  It did show some signs of constipation.  You have a lot of medication allergies and it is difficult to put you on any pain medicine because of this.  We are prescribing you a Lidoderm patch that may help the symptoms.  You should also drink plenty of fluids and take a laxative.  Please contact your primary care doctor for close follow-up.  Return to the emergency department if any worsening symptoms.

## 2019-12-30 NOTE — ED Provider Notes (Signed)
[place] DEPT Provider Note   CSN: [id number] Arrival date & time: 12/30/19  0756     History Chief Complaint  Patient presents with  . Flank Pain    [name] is a 86 y.o. female.  She is complaining of left flank pain that started last evening.  She said it kept her up all night long.  She was at urgent care 2 days ago with dysuria and treated with Keflex for possible UTI.  Her urine culture grew out less than 10,000 colonies.  She said her dysuria has improved.  No hematuria.  No trauma or falls.  No fevers chills.  Positive nausea.  The history is provided by the patient.  Flank Pain This is a new problem. The current episode started yesterday. The problem occurs constantly. The problem has not changed since onset.Pertinent negatives include no chest pain, no abdominal pain, no headaches and no shortness of breath. Nothing aggravates the symptoms. Nothing relieves the symptoms. She has tried nothing for the symptoms. The treatment provided no relief.       Past Medical History:  Diagnosis Date  . Allergy   . Bleeding nose   . Hyperlipidemia   . Hypertension   . Thyroid cancer [facility])     Patient Active Problem List   Diagnosis Date Noted  . Thyroid cancer, multifocal papillary thryroid cancer 06/18/2011    Past Surgical History:  Procedure Laterality Date  . ABDOMINAL HYSTERECTOMY     partial  . APPENDECTOMY    . BACK SURGERY     rod and screws  . BREAST SURGERY     augmentation  . CARDIAC CATHETERIZATION    . CATARACT EXTRACTION     both  . EYE SURGERY     both  . LAMINECTOMY    . NASAL SEPTUM SURGERY    . OOPHORECTOMY    . SHOULDER SURGERY     left and right  . SINUS IRRIGATION    . SUBACROMIAL DECOMPRESSION    . THYROIDECTOMY  2012     OB History   No obstetric history on file.     Family History  Problem Relation Age of Onset  . Cancer Mother        breast  . Stroke Mother   . Heart disease Father   .  Cancer Father        lung    Social History   Tobacco Use  . Smoking status: Never Smoker  . Smokeless tobacco: Never Used  Substance Use Topics  . Alcohol use: Yes    Comment: Occasionally  . Drug use: No    Home Medications Prior to Admission medications   Medication Sig Start Date End Date Taking? Authorizing Provider  acetaminophen (TYLENOL) 325 MG tablet Take 2 tablets (650 mg total) by mouth every 6 (six) hours as needed. 01/13/19   Khatri, Hina, PA-C  benzonatate (TESSALON) 100 MG capsule Take 2 capsules (200 mg total) by mouth every 8 (eight) hours. Patient not taking: Reported on 12/21/2019 01/13/19   Delia Heady, PA-C  bisoprolol (ZEBETA) 10 MG tablet Take 5 mg by mouth 2 (two) times daily. 10/12/16   [provider]  cephALEXin (KEFLEX) 500 MG capsule Take 1 capsule (500 mg total) by mouth 2 (two) times daily for 7 days. 12/28/19 01/04/20  Faustino Congress, NP  diclofenac Sodium (VOLTAREN) 1 % GEL Apply 2 g topically 4 (four) times daily. 12/21/19   Khatri, Hina, PA-C  fluticasone (  FLONASE) 50 MCG/ACT nasal spray Place 1 spray into both nostrils daily. Patient not taking: Reported on 12/21/2019 01/13/19   Delia Heady, PA-C  ondansetron (ZOFRAN) 4 MG tablet Take 1 tablet (4 mg total) by mouth every 6 (six) hours. 12/28/19   Faustino Congress, NP  Polyethyl Glycol-Propyl Glycol (SYSTANE OP) Place 1 drop into both eyes 4 (four) times daily as needed (dry eyes).    [provider]  RESTASIS 0.05 % ophthalmic emulsion Place 1 drop into both eyes every 12 (twelve) hours.  01/17/12   [provider]  SYNTHROID 75 MCG tablet Take 75 mcg by mouth daily. 09/12/16   [provider]  tizanidine (ZANAFLEX) 2 MG capsule Take 1 capsule (2 mg total) by mouth 3 (three) times daily. 12/21/19   Khatri, Hina, PA-C  valsartan-hydrochlorothiazide (DIOVAN-HCT) 160-25 MG per tablet Take 1 tablet by mouth daily.      [provider]    Allergies      Iodinated diagnostic agents, Bextra [valdecoxib], Ceclor [cefaclor], Celebrex [celecoxib], Codeine, Demerol, Doxycycline, Erythromycin, Meloxicam, Morphine and related, Penicillins, Scopolamine, Sulfur, Tramadol, Vicodin [hydrocodone-acetaminophen], Vioxx [rofecoxib], and Quinine derivatives  Review of Systems   Review of Systems  Constitutional: Negative for fever.  HENT: Negative for sore throat.   Eyes: Negative for visual disturbance.  Respiratory: Negative for shortness of breath.   Cardiovascular: Negative for chest pain.  Gastrointestinal: Positive for nausea. Negative for abdominal pain and vomiting.  Genitourinary: Positive for flank pain. Negative for dysuria.  Musculoskeletal: Positive for back pain.  Skin: Negative for rash.  Neurological: Negative for headaches.    Physical Exam Updated Vital Signs BP (!) 183/72 (BP Location: Right Arm)   Pulse (!) 56   Temp 97.6 F (36.4 C) (Oral)   Resp 16   Ht 5\' 3"  (1.6 m)   Wt 67.1 kg   SpO2 98%   BMI 26.22 kg/m   Physical Exam Vitals and nursing note reviewed.  Constitutional:      General: She is not in acute distress.    Appearance: She is well-developed.  HENT:     Head: Normocephalic and atraumatic.  Eyes:     Conjunctiva/sclera: Conjunctivae normal.  Cardiovascular:     Rate and Rhythm: Normal rate and regular rhythm.     Heart sounds: No murmur.  Pulmonary:     Effort: Pulmonary effort is normal. No respiratory distress.     Breath sounds: Normal breath sounds.  Abdominal:     Palpations: Abdomen is soft.     Tenderness: There is no abdominal tenderness.  Musculoskeletal:        General: Normal range of motion.     Cervical back: Neck supple.  Skin:    General: Skin is warm and dry.     Capillary Refill: Capillary refill takes less than 2 seconds.  Neurological:     General: No focal deficit present.     Mental Status: She is alert.     Gait: Gait normal.     ED Results / Procedures / Treatments    Labs (all labs ordered are listed, but only abnormal results are displayed) Labs Reviewed  URINALYSIS, ROUTINE W REFLEX MICROSCOPIC - Abnormal; Notable for the following components:      Result Value   Leukocytes,Ua SMALL (*)    All other components within normal limits  BASIC METABOLIC PANEL - Abnormal; Notable for the following components:   Sodium 132 (*)    Chloride 97 (*)    Creatinine,  Ser 1.22 (*)    Calcium 8.8 (*)    GFR calc non Af Amer 40 (*)    GFR calc Af Amer 46 (*)    All other components within normal limits  URINE CULTURE  CBC WITH DIFFERENTIAL/PLATELET    EKG None  Radiology CT Renal Stone Study  Result Date: 12/30/2019 CLINICAL DATA:  Left flank pain EXAM: CT ABDOMEN AND PELVIS WITHOUT CONTRAST TECHNIQUE: Multidetector CT imaging of the abdomen and pelvis was performed following the standard protocol without IV contrast. COMPARISON:  02/11/2011 FINDINGS: Lower chest: No acute abnormality. Hepatobiliary: No focal hepatic abnormality. Gallbladder unremarkable. Pancreas: No focal abnormality or ductal dilatation. Spleen: No focal abnormality.  Normal size. Adrenals/Urinary Tract: No adrenal abnormality. No focal renal abnormality. No stones or hydronephrosis. Urinary bladder is unremarkable. Stomach/Bowel: Sigmoid diverticulosis. No active diverticulitis. Moderate stool throughout the colon. Stomach and small bowel unremarkable. Vascular/Lymphatic: Heavily calcified aorta and iliac vessels. No aneurysm or adenopathy. Reproductive: Prior hysterectomy.  No adnexal masses. Other: No free fluid or free air. Musculoskeletal: Postoperative changes from posterior fusion in the lower lumbar spine. No acute bony abnormality. IMPRESSION: No renal or ureteral stones.  No hydronephrosis. Sigmoid diverticulosis. No active diverticulitis. Moderate stool burden. Aortoiliac atherosclerosis. No acute findings in the abdomen or pelvis. Electronically Signed   By: Rolm Baptise M.D.   On:  12/30/2019 09:59    Procedures Procedures (including critical care time)  Medications Ordered in ED Medications  sodium chloride 0.9 % bolus 500 mL (0 mLs Intravenous Stopped 12/30/19 0949)  ondansetron (ZOFRAN) injection 4 mg (4 mg Intravenous Given 12/30/19 0904)  fentaNYL (SUBLIMAZE) injection 50 mcg (50 mcg Intravenous Given 12/30/19 0904)  fentaNYL (SUBLIMAZE) injection 50 mcg (50 mcg Intravenous Given 12/30/19 1054)    ED Course  I have reviewed the triage vital signs and the nursing notes.  Pertinent labs & imaging results that were available during my care of the patient were reviewed by me and considered in my medical decision making (see chart for details).  Clinical Course as of Dec 29 1804  Fri Dec 30, 2019  [id number] Differential diagnosis includes pyelonephritis, renal colic, musculoskeletal, vascular, metabolic derangement   [MB]  1054 Patient's work-up fairly unremarkable.  Creatinine slightly elevated from baseline likely due to some dehydration.  Urine possible infection 6-10 whites but nitrite negative no bacteria seen.  Normal white count normal hemoglobin.  CT imaging not showing any signs of stone.  Does have moderate stool.   [MB]  [id number] Reevaluated the patient's left flank.  No overlying skin findings such as shingles.   [MB]    Clinical Course User Index [MB] Hayden Rasmussen, MD   MDM Rules/Calculators/A&P                       Final Clinical Impression(s) / ED Diagnoses Final diagnoses:  Left flank pain  Constipation, unspecified constipation type    Rx / DC Orders ED Discharge Orders         Ordered    lidocaine (LIDODERM) 5 %  Every 24 hours     12/30/19 1141           Hayden Rasmussen, MD 12/30/19 [phone]

## 2019-12-30 NOTE — ED Notes (Signed)
Pt transported to CT ?

## 2019-12-31 LAB — URINE CULTURE: Culture: NO GROWTH

## 2020-06-27 IMAGING — CT CT RENAL STONE PROTOCOL
2 of 4 series · 17 of 46 positions shown, 19 images · non-contrast
Comparison: 02/11/2011

CLINICAL DATA: Left flank pain

EXAM:
CT ABDOMEN AND PELVIS WITHOUT CONTRAST
TECHNIQUE: Multidetector CT imaging of the abdomen and pelvis was performed
following the standard protocol without IV contrast.

[Series 2: axial st · axial · 0.68mm/px · z∈[-486,-106]mm · 14 of 88 slices shown, 16 images]
[im 6/88  soft-tissue]
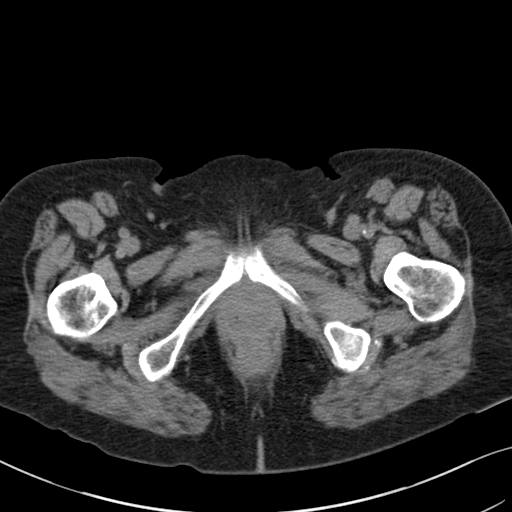
[im 6/88  bone]
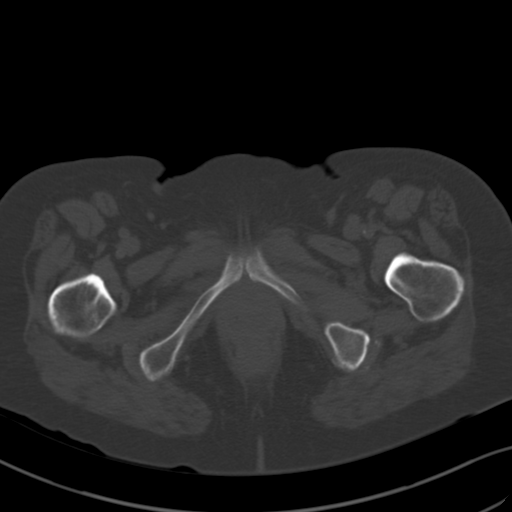
[im 12/88  soft-tissue]
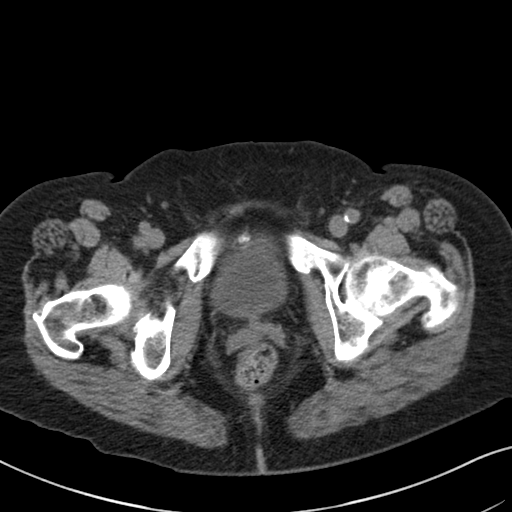
[im 18/88  soft-tissue]
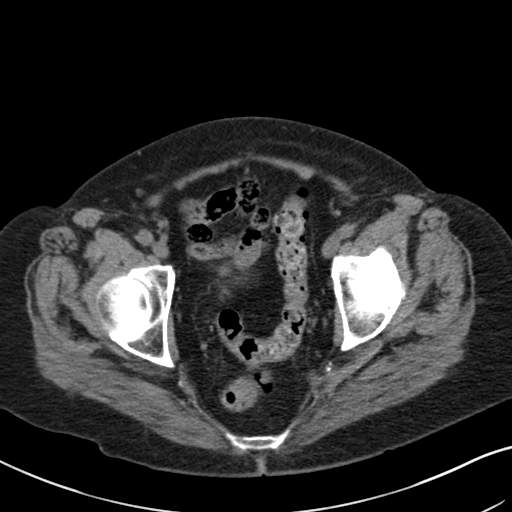
[im 24/88  soft-tissue]
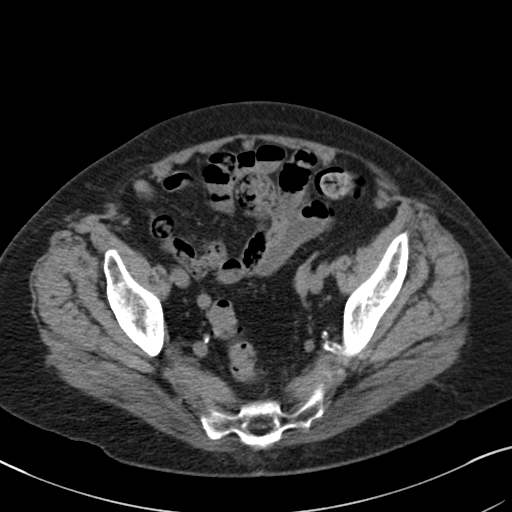
[im 30/88  soft-tissue]
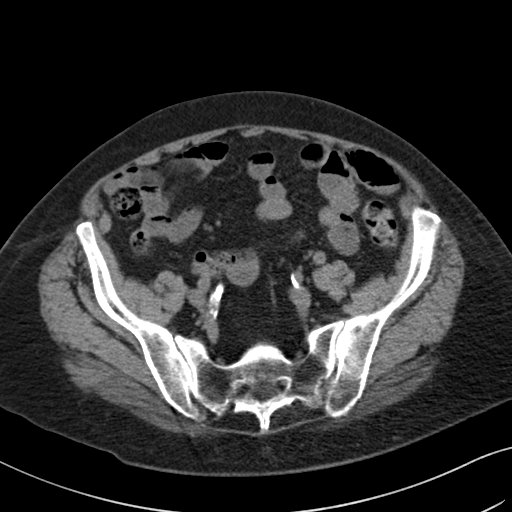
[im 35/88  soft-tissue]
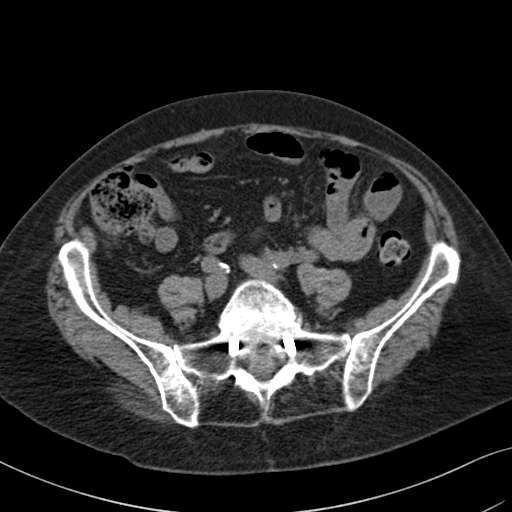
[im 41/88  soft-tissue]
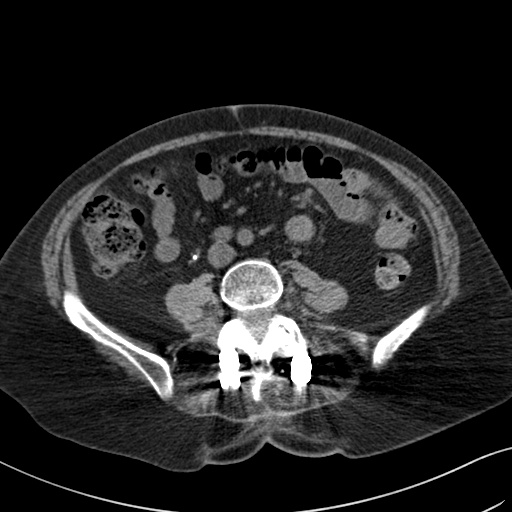
[im 47/88  soft-tissue]
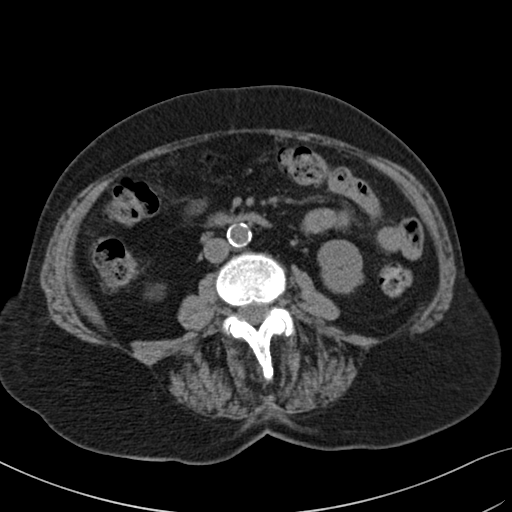
[im 53/88  soft-tissue]
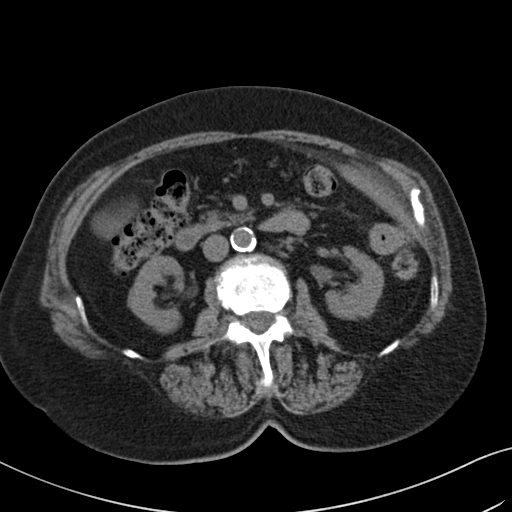
[im 53/88  bone]
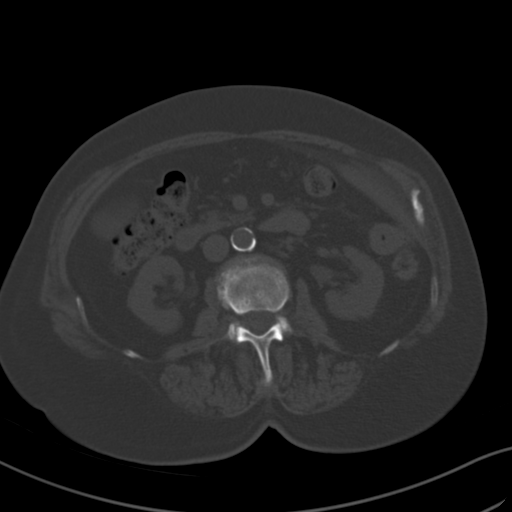
[im 59/88  soft-tissue]
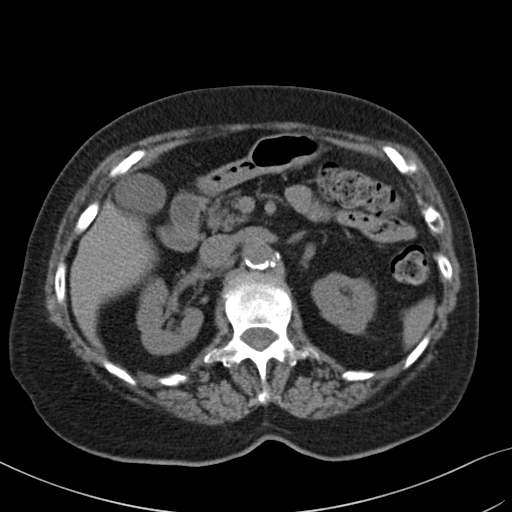
[im 64/88  soft-tissue]
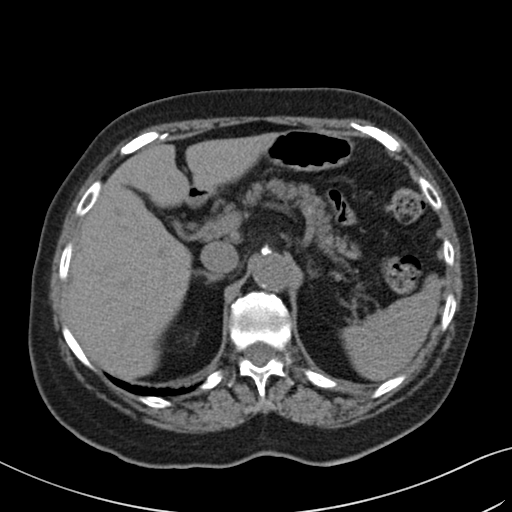
[im 70/88  soft-tissue]
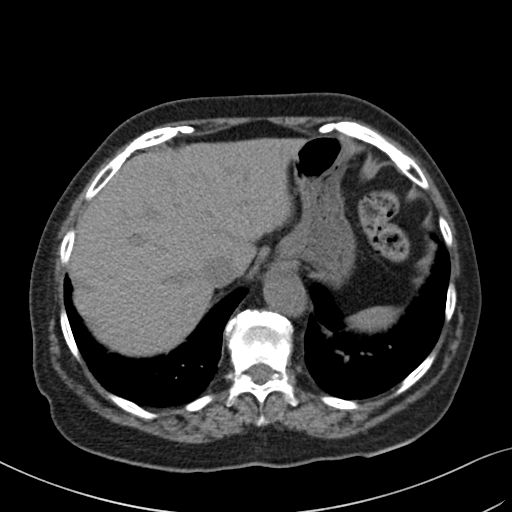
[im 76/88  soft-tissue]
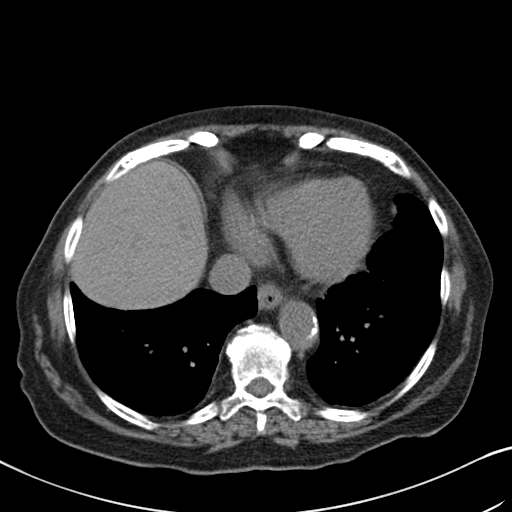
[im 82/88  soft-tissue]
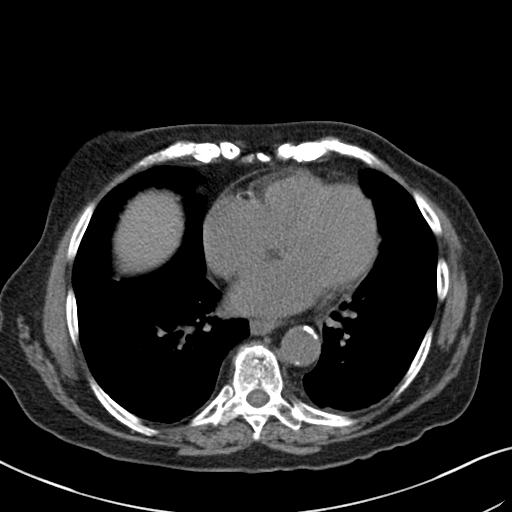

[Series 4: coronal · coronal · 0.80mm/px · 3 of 117 slices shown]
[im 39/117  soft-tissue]
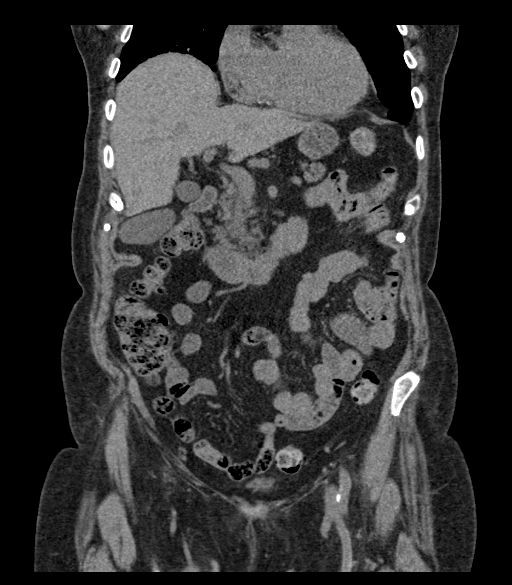
[im 52/117  soft-tissue]
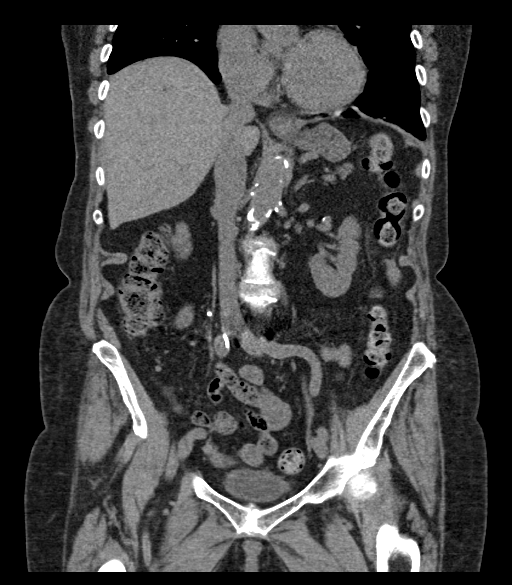
[im 65/117  soft-tissue]
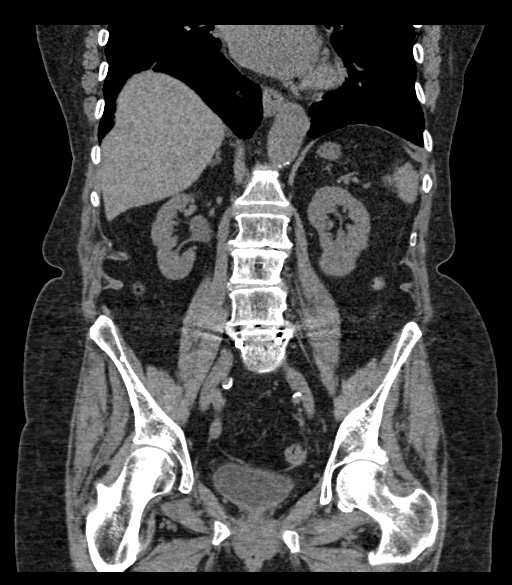

[17 of 46 positions shown; findings below may reference images not displayed]

FINDINGS: Lower chest: No acute abnormality.

Hepatobiliary: No focal hepatic abnormality. Gallbladder
unremarkable.

Pancreas: No focal abnormality or ductal dilatation.

Spleen: No focal abnormality.  Normal size.

Adrenals/Urinary Tract: No adrenal abnormality. No focal renal
abnormality. No stones or hydronephrosis. Urinary bladder is
unremarkable.

Stomach/Bowel: Sigmoid diverticulosis. No active diverticulitis.
Moderate stool throughout the colon. Stomach and small bowel
unremarkable.

Vascular/Lymphatic: Heavily calcified aorta and iliac vessels. No
aneurysm or adenopathy.

Reproductive: Prior hysterectomy.  No adnexal masses.

Other: No free fluid or free air.

Musculoskeletal: Postoperative changes from posterior fusion in the
lower lumbar spine. No acute bony abnormality.
IMPRESSION: No renal or ureteral stones.  No hydronephrosis.

Sigmoid diverticulosis. No active diverticulitis. Moderate stool
burden.

Aortoiliac atherosclerosis.

No acute findings in the abdomen or pelvis.

## 2020-07-11 NOTE — Progress Notes (Signed)
New Patient Note  RE: [name] MRN: [id number] DOB: Apr 10, 1931 Date of Office Visit: 07/12/2020  Referring provider: Leighton Ruff, MD Primary care provider: Leighton Ruff, MD  Chief Complaint: Other (hx of anaphylaxis to V dye. Would like to know if she can get the COVID vaccine)  History of Present Illness: I had the pleasure of seeing [name] for initial evaluation at the [facility] [place] [facility] [place] on 07/13/2020. She is a 86 y.o. female, who is referred here by Leighton Ruff, MD for the evaluation of IV dye allergy.  She is accompanied today by her daughter who provided/contributed to the history.   Any known reactions to polyethylene glycol or polysorbate?  No  Any history of anaphylaxis to vaccinations? Patient had Shingles, tetanus, pneumonia and flu shots in the past with no issues.   Any history of reactions to injectable medications? No  Any history of anaphylaxis to colonoscopy preps (i.e.Miralax)? No  Any history of dermal filler treatments in the last year? No  Patient had reaction to IVP dye in 1974. She was having urinary issues and she had some type of procedure done to look at her urine flow.  Patient was at a doctor's office and after the procedure was done patient passed out. She was taken to the ER and they could not get her blood pressure up and was hospitalized for 1 week. She had cardiac arrest per daughter's report. Patient had IV dye before with no issues. She is not sure of any other associated symptoms.   Assessment and Plan: Tache is a 86 y.o. 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 Patient was hospitalized for a week afterwards and per daughter's report she had a cardiac arrest episode. No other symptoms. She had IV dye before with no issues. Patient had other vaccines with no issues.  Discussed with patient and daughter  that IV dye does not contain any products that are found in the COVID-19 vaccines that we are aware of.  To be cautious given the reaction she had, we will schedule for COVID-19 component testing and if negative administer first dose of the vaccine in our office.   Continue to avoid IV dye.  Return in about 1 day (around 07/13/2020).  Other allergy screening: Asthma: no Rhino conjunctivitis: yes  Mild rhinitis symptoms and was on allergy injections in the past.  Food allergy: no Hymenoptera allergy: no Urticaria: no Eczema:no History of recurrent infections suggestive of immunodeficency: no  Diagnostics: None.  Past Medical History: Patient Active Problem List   Diagnosis Date Noted  . Drug reaction 07/13/2020  . Thyroid cancer, multifocal papillary thryroid cancer 06/18/2011   Past Medical History:  Diagnosis Date  . Allergy   . Bleeding nose   . Hyperlipidemia   . Hypertension   . Thyroid cancer [facility])    Past Surgical History: Past Surgical History:  Procedure Laterality Date  . ABDOMINAL HYSTERECTOMY     partial  . APPENDECTOMY    . BACK SURGERY     rod and screws  . BREAST SURGERY     augmentation  . CARDIAC CATHETERIZATION    . CATARACT EXTRACTION     both  . EYE SURGERY     both  . LAMINECTOMY    . NASAL SEPTUM SURGERY    . OOPHORECTOMY    . SHOULDER SURGERY  left and right  . SINUS IRRIGATION    . SUBACROMIAL DECOMPRESSION    . THYROIDECTOMY  2012   Medication List:  Current Outpatient Medications  Medication Sig Dispense Refill  . bisoprolol (ZEBETA) 10 MG tablet Take 5 mg by mouth 2 (two) times daily. (Patient not taking: Reported on 07/13/2020)    . Polyethyl Glycol-Propyl Glycol (SYSTANE OP) Place 1 drop into both eyes 4 (four) times daily as needed (dry eyes). (Patient not taking: Reported on 07/13/2020)    . RESTASIS 0.05 % ophthalmic emulsion Place 1 drop into both eyes as needed.     Marland Kitchen SYNTHROID 75 MCG tablet Take 75 mcg by mouth daily.     . valsartan-hydrochlorothiazide (DIOVAN-HCT) 160-25 MG per tablet Take 1 tablet by mouth daily.      Marland Kitchen acetaminophen (TYLENOL) 325 MG tablet Take 2 tablets (650 mg total) by mouth every 6 (six) hours as needed. 15 tablet 0  . diclofenac Sodium (VOLTAREN) 1 % GEL Apply 2 g topically 4 (four) times daily. (Patient not taking: Reported on 07/12/2020) 100 g 0  . fluticasone (FLONASE) 50 MCG/ACT nasal spray Place 1 spray into both nostrils daily. (Patient not taking: Reported on 12/21/2019) 16 g 2  . lidocaine (LIDODERM) 5 % Place 1 patch onto the skin daily. Remove & Discard patch within 12 hours or as directed by MD (Patient not taking: Reported on 07/12/2020) 30 patch 0  . ondansetron (ZOFRAN) 4 MG tablet Take 1 tablet (4 mg total) by mouth every 6 (six) hours. (Patient not taking: Reported on 07/12/2020) 12 tablet 0  . tizanidine (ZANAFLEX) 2 MG capsule Take 1 capsule (2 mg total) by mouth 3 (three) times daily. (Patient not taking: Reported on 07/12/2020) 10 capsule 0   No current facility-administered medications for this visit.   Allergies: Allergies  Allergen Reactions  . Iodinated Diagnostic Agents Anaphylaxis  . Bextra [Valdecoxib] Nausea And Vomiting  . Ceclor [Cefaclor] Nausea And Vomiting  . Celebrex [Celecoxib] Nausea And Vomiting  . Codeine Other (See Comments)    Makes head feel like its going to explode  . Demerol     Hallucinate   . Doxycycline Nausea And Vomiting  . Erythromycin Nausea And Vomiting  . Meloxicam Nausea And Vomiting  . Morphine And Related Nausea And Vomiting and Other (See Comments)    Loopy, knocked out  . Penicillins Nausea And Vomiting    Has patient had a PCN reaction causing immediate rash, facial/tongue/throat swelling, SOB or lightheadedness with hypotension: no Has patient had a PCN reaction causing severe rash involving mucus membranes or skin necrosis: no Has patient had a PCN reaction that required hospitalization: no Has patient had a PCN  reaction occurring within the last 10 years: no If all of the above answers are "NO", then may proceed with Cephalosporin use.   Marland Kitchen Scopolamine     Hallucinate   . Sulfur Nausea And Vomiting  . Tramadol Nausea And Vomiting  . Vicodin [Hydrocodone-Acetaminophen]     hallucinate  . Vioxx [Rofecoxib] Nausea And Vomiting  . Quinine Derivatives Rash   Social History: Social History   Socioeconomic History  . Marital status: Married    Spouse name: Not on file  . Number of children: Not on file  . Years of education: Not on file  . Highest education level: Not on file  Occupational History  . Not on file  Tobacco Use  . Smoking status: Never Smoker  . Smokeless tobacco: Never Used  Vaping  Use  . Vaping Use: Never used  Substance and Sexual Activity  . Alcohol use: Yes    Comment: Occasionally  . Drug use: No  . Sexual activity: Not on file  Other Topics Concern  . Not on file  Social History Narrative  . Not on file   Social Determinants of Health   Financial Resource Strain:   . Difficulty of Paying Living Expenses: Not on file  Food Insecurity:   . Worried About Charity fundraiser in the Last Year: Not on file  . Ran Out of Food in the Last Year: Not on file  Transportation Needs:   . Lack of Transportation (Medical): Not on file  . Lack of Transportation (Non-Medical): Not on file  Physical Activity:   . Days of Exercise per Week: Not on file  . Minutes of Exercise per Session: Not on file  Stress:   . Feeling of Stress : Not on file  Social Connections:   . Frequency of Communication with Friends and Family: Not on file  . Frequency of Social Gatherings with Friends and Family: Not on file  . Attends Religious Services: Not on file  . Active Member of Clubs or Organizations: Not on file  . Attends Archivist Meetings: Not on file  . Marital Status: Not on file   Lives in a 84 year old home. Smoking: denies Occupation: retired  Programme researcher, broadcasting/film/video  HistoryFreight forwarder in the house: no Charity fundraiser in the family room: no Carpet in the bedroom: yes Heating: gas Cooling: central Pet: yes 1 dog  Family History: Family History  Problem Relation Age of Onset  . Cancer Mother        breast  . Stroke Mother   . Heart disease Father   . Cancer Father        lung  . Asthma Father   . Allergic rhinitis Father    Review of Systems  Constitutional: Negative for appetite change, chills, fever and unexpected weight change.  HENT: Negative for congestion and rhinorrhea.   Eyes: Negative for itching.  Respiratory: Negative for cough, chest tightness, shortness of breath and wheezing.   Cardiovascular: Negative for chest pain.  Gastrointestinal: Negative for abdominal pain.  Genitourinary: Negative for difficulty urinating.  Skin: Negative for rash.  Neurological: Negative for headaches.   Objective: BP 110/60   Pulse (!) 54   Temp 98.4 F (36.9 C) (Oral)   Resp 16   Ht 5' 1.1" (1.552 m)   Wt 142 lb 1.6 oz (64.5 kg)   SpO2 94%   BMI 26.76 kg/m  Body mass index is 26.76 kg/m. Physical Exam Vitals and nursing note reviewed.  Constitutional:      Appearance: Normal appearance. She is well-developed.  HENT:     Head: Normocephalic and atraumatic.     Right Ear: External ear normal.     Left Ear: External ear normal.     Nose: Nose normal.     Mouth/Throat:     Mouth: Mucous membranes are moist.     Pharynx: Oropharynx is clear.  Eyes:     Conjunctiva/sclera: Conjunctivae normal.  Cardiovascular:     Rate and Rhythm: Normal rate and regular rhythm.     Heart sounds: Normal heart sounds. No murmur heard.  No friction rub. No gallop.   Pulmonary:     Effort: Pulmonary effort is normal.     Breath sounds: Normal breath sounds. No wheezing, rhonchi or rales.  Abdominal:  Palpations: Abdomen is soft.  Musculoskeletal:     Cervical back: Neck supple.  Skin:    General: Skin is warm.     Findings: No rash.    Neurological:     Mental Status: She is alert and oriented to person, place, and time.  Psychiatric:        Behavior: Behavior normal.    The plan was reviewed with the patient/family, and all questions/concerned were addressed.  It was my pleasure to see [name] today and participate in her care. Please feel free to contact me with any questions or concerns.  Sincerely,  Rexene Alberts, DO Allergy & Immunology  [facility] office: [phone] [facility] office: [place] office: [phone]

## 2020-07-12 ENCOUNTER — Encounter: Payer: Self-pay | Admitting: Allergy

## 2020-07-12 ENCOUNTER — Other Ambulatory Visit: Payer: Self-pay

## 2020-07-12 ENCOUNTER — Ambulatory Visit: Payer: Medicare Other | Admitting: Allergy

## 2020-07-12 VITALS — BP 110/60 | HR 54 | Temp 98.4°F | Resp 16 | Ht 61.1 in | Wt 142.1 lb

## 2020-07-12 DIAGNOSIS — T50905D Adverse effect of unspecified drugs, medicaments and biological substances, subsequent encounter: Secondary | ICD-10-CM | POA: Diagnosis not present

## 2020-07-12 NOTE — Patient Instructions (Addendum)
Return tomorrow for COVID-19 component testing and potential vaccine. Plan on being here for 2-3 hours.

## 2020-07-13 ENCOUNTER — Ambulatory Visit: Payer: Medicare Other

## 2020-07-13 ENCOUNTER — Encounter: Payer: Self-pay | Admitting: Allergy

## 2020-07-13 ENCOUNTER — Ambulatory Visit: Payer: Medicare Other | Admitting: Allergy

## 2020-07-13 VITALS — BP 118/72 | HR 60 | Temp 97.5°F | Resp 16 | Ht 62.0 in | Wt 135.0 lb

## 2020-07-13 DIAGNOSIS — Z23 Encounter for immunization: Secondary | ICD-10-CM

## 2020-07-13 DIAGNOSIS — T50905D Adverse effect of unspecified drugs, medicaments and biological substances, subsequent encounter: Secondary | ICD-10-CM

## 2020-07-13 DIAGNOSIS — T50905A Adverse effect of unspecified drugs, medicaments and biological substances, initial encounter: Secondary | ICD-10-CM | POA: Insufficient documentation

## 2020-07-13 NOTE — Patient Instructions (Signed)
   Today's skin prick and intradermal testing was negative to Miralax (source of PEG 3350), methylprednisolone acetate (source of PEG 3350), and triamcinolone acetonide (source of polysorbate-80).  You received your first Moderna vaccine today.  You need go get your second dose in 4 weeks.

## 2020-07-13 NOTE — Assessment & Plan Note (Addendum)
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 Patient was hospitalized for a week afterwards and per daughter's report she had a cardiac arrest episode. No other symptoms. She had IV dye before with no issues. Patient had other vaccines with no issues.  Discussed with patient and daughter that IV dye does not contain any products that are found in the COVID-19 vaccines that we are aware of.  To be cautious given the reaction she had, we will schedule for COVID-19 component testing and if negative administer first dose of the vaccine in our office.   Continue to avoid IV dye.

## 2020-07-13 NOTE — Assessment & Plan Note (Addendum)
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 Patient was hospitalized for a week afterwards and per daughter's report she had a cardiac arrest episode. No other symptoms. She had IV dye before with no issues. Patient had other vaccines with no issues.  Patient concerned about getting the vaccine given her reactions to the IV dye.  Today's skin prick and intradermal testing was negative to Miralax (source of PEG 3350), methylprednisolone acetate (source of PEG 3350), and triamcinolone acetonide (source of polysorbate-80).  Patient received her second Moderna vaccine in a graded challenge dose. ? 1st dose Moderna 0.1cc with 15 minute wait. ? 2nd dose Moderna 0.4cc with 30 minute wait.  Patient tolerated the vaccine with no issues.   Patient due for her second vaccine in 4 weeks.   Okay to get flu vaccine in between.

## 2020-07-13 NOTE — Progress Notes (Signed)
Follow Up Note  RE: [name] MRN: [id number] DOB: [birth year] Date of Office Visit: 07/13/2020  Referring provider: Leighton Ruff, MD Primary care provider: Leighton Ruff, MD  Chief Complaint: Allergic Reaction  Assessment and Plan: Tearra is a 86 y.o. 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 Patient was hospitalized for a week afterwards and per daughter's report she had a cardiac arrest episode. No other symptoms. She had IV dye before with no issues. Patient had other vaccines with no issues.  Patient concerned about getting the vaccine given her reactions to the IV dye.  Today's skin prick and intradermal testing was negative to Miralax (source of PEG 3350), methylprednisolone acetate (source of PEG 3350), and triamcinolone acetonide (source of polysorbate-80).  Patient received her second Moderna vaccine in a graded challenge dose. ? 1st dose Moderna 0.1cc with 15 minute wait. ? 2nd dose Moderna 0.4cc with 30 minute wait.  Patient tolerated the vaccine with no issues.   Patient due for her second vaccine in 4 weeks.   Okay to get flu vaccine in between.  Plan: Challenge drug: [place] as per protocol: Passed  History of Present Illness: I had the pleasure of seeing [name] for a follow up visit at the [facility] [place] [facility] [place] on 07/13/2020. She is a 86 y.o. female, who is being followed for adverse drug reaction. Her previous allergy office visit was on 07/12/2020 with Dr. Maudie Mercury. Today she is here for Covid-19 component drug testing and challenge.  She is accompanied today by her husband who provided/contributed to the history.   History of Reaction: Any known reactions to polyethylene glycol or polysorbate?  No  Any history of anaphylaxis to vaccinations? Patient had Shingles, tetanus, pneumonia and flu shots in the past with  no issues.   Any history of reactions to injectable medications? No  Any history of anaphylaxis to colonoscopy preps (i.e.Miralax)? No  Any history of dermal filler treatments in the last year? 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 Patient was hospitalized for a week afterwards and per daughter's report she had a cardiac arrest episode. No other symptoms. She had IV dye before with no issues.  Interval History: Patient has not been ill, she has not had any accidental exposures to the culprit medication.   Recent/Current History: Pulmonary disease: no Cardiac disease: yes  hypertension Respiratory infection: no Rash: no Itch: no Swelling: no Cough: no Shortness of breath: no Runny/stuffy nose: no Itchy eyes: no Beta-blocker use: no  Patient/guardian was informed of the test procedure with verbalized understanding of the risk of anaphylaxis. Consent was signed.   Last antihistamine use: none in the past 3 days Last beta-blocker use: n/a  Medication List:  Current Outpatient Medications  Medication Sig Dispense Refill  . acetaminophen (TYLENOL) 325 MG tablet Take 2 tablets (650 mg total) by mouth every 6 (six) hours as needed. 15 tablet 0  . RESTASIS 0.05 % ophthalmic emulsion Place 1 drop into both eyes as needed.     Marland Kitchen SYNTHROID 75 MCG tablet Take 75 mcg by mouth daily.    . valsartan-hydrochlorothiazide (DIOVAN-HCT) 160-25 MG per tablet Take 1 tablet by mouth daily.      . bisoprolol (ZEBETA) 10 MG tablet Take 5 mg by mouth 2 (  two) times daily. (Patient not taking: Reported on 07/13/2020)    . diclofenac Sodium (VOLTAREN) 1 % GEL Apply 2 g topically 4 (four) times daily. (Patient not taking: Reported on 07/12/2020) 100 g 0  . fluticasone (FLONASE) 50 MCG/ACT nasal spray Place 1 spray into both nostrils daily. (Patient not taking: Reported on 12/21/2019) 16 g 2  . lidocaine (LIDODERM) 5 %  Place 1 patch onto the skin daily. Remove & Discard patch within 12 hours or as directed by MD (Patient not taking: Reported on 07/12/2020) 30 patch 0  . ondansetron (ZOFRAN) 4 MG tablet Take 1 tablet (4 mg total) by mouth every 6 (six) hours. (Patient not taking: Reported on 07/12/2020) 12 tablet 0  . Polyethyl Glycol-Propyl Glycol (SYSTANE OP) Place 1 drop into both eyes 4 (four) times daily as needed (dry eyes). (Patient not taking: Reported on 07/13/2020)    . tizanidine (ZANAFLEX) 2 MG capsule Take 1 capsule (2 mg total) by mouth 3 (three) times daily. (Patient not taking: Reported on 07/12/2020) 10 capsule 0   No current facility-administered medications for this visit.   Allergies: Allergies  Allergen Reactions  . Iodinated Diagnostic Agents Anaphylaxis  . Bextra [Valdecoxib] Nausea And Vomiting  . Ceclor [Cefaclor] Nausea And Vomiting  . Celebrex [Celecoxib] Nausea And Vomiting  . Codeine Other (See Comments)    Makes head feel like its going to explode  . Demerol     Hallucinate   . Doxycycline Nausea And Vomiting  . Erythromycin Nausea And Vomiting  . Meloxicam Nausea And Vomiting  . Morphine And Related Nausea And Vomiting and Other (See Comments)    Loopy, knocked out  . Penicillins Nausea And Vomiting    Has patient had a PCN reaction causing immediate rash, facial/tongue/throat swelling, SOB or lightheadedness with hypotension: no Has patient had a PCN reaction causing severe rash involving mucus membranes or skin necrosis: no Has patient had a PCN reaction that required hospitalization: no Has patient had a PCN reaction occurring within the last 10 years: no If all of the above answers are "NO", then may proceed with Cephalosporin use.   Marland Kitchen Scopolamine     Hallucinate   . Sulfur Nausea And Vomiting  . Tramadol Nausea And Vomiting  . Vicodin [Hydrocodone-Acetaminophen]     hallucinate  . Vioxx [Rofecoxib] Nausea And Vomiting  . Quinine Derivatives Rash   I reviewed  her past medical history, social history, family history, and environmental history and no significant changes have been reported from her previous visit.  Review of Systems  Constitutional: Negative for appetite change, chills, fever and unexpected weight change.  HENT: Negative for congestion and rhinorrhea.   Eyes: Negative for itching.  Respiratory: Negative for cough, chest tightness, shortness of breath and wheezing.   Cardiovascular: Negative for chest pain.  Gastrointestinal: Negative for abdominal pain.  Genitourinary: Negative for difficulty urinating.  Skin: Negative for rash.  Neurological: Negative for headaches.   Objective: BP 118/72   Pulse 60   Temp (!) 97.5 F (36.4 C) (Temporal)   Resp 16   Ht 5\' 2"  (1.575 m)   Wt 135 lb (61.2 kg)   SpO2 97%   BMI 24.69 kg/m  Body mass index is 24.69 kg/m. Physical Exam Vitals and nursing note reviewed. Exam conducted with a chaperone present.  Constitutional:      Appearance: Normal appearance. She is well-developed.  HENT:     Head: Normocephalic and atraumatic.     Nose: Nose normal.  Mouth/Throat:     Mouth: Mucous membranes are moist.     Pharynx: Oropharynx is clear.  Eyes:     Conjunctiva/sclera: Conjunctivae normal.  Cardiovascular:     Rate and Rhythm: Normal rate and regular rhythm.     Heart sounds: Normal heart sounds. No murmur heard.  No friction rub. No gallop.   Pulmonary:     Effort: Pulmonary effort is normal.     Breath sounds: Normal breath sounds. No wheezing, rhonchi or rales.  Musculoskeletal:     Cervical back: Neck supple.  Skin:    General: Skin is warm.     Findings: No rash.  Neurological:     Mental Status: She is alert and oriented to person, place, and time.  Psychiatric:        Behavior: Behavior normal.     Diagnostics: Results discussed with patient/family.  COVID Vaccine Testing - 07/13/20 1235      Test Information   Consent Yes    Medications Miralax     Triamcinolone Lot # [id number]    Triamcinolone EXP DATE 09/17/20    Methylprednisolone Lot # [id number]    Methylprednisolone EXP DATE 01/16/20    Miralax Lot # [id number]    Miralax EXP DATE 03/17/20      Pre Test Vitals   BP 118/72    Pulse 60    Resp 16      SKIN PRICK TESTING - Arm #1   Location Right Arm;Left Arm      HISTAMINE (1mg /mL) Skin Prick Arm #1   Histamine Time Testing Placed 1005    Histamine Wheal 2+      Control (negative - HSA) Skin Prick Arm #1   Control Time Testing Placed 1005    Control Wheal Negative      Triamcinolone (40mg /mL) Skin Prick Arm #1   Triamcinolone Time Testing Placed 1005    Triamcinolone Wheal Negative      Methylprednisolone (40mg /mL) Skin Prick Arm #1   Methylprednisolone Time Testing Placed 1005    Methylprednisolone Wheal Negative      Miralax (1:100 or 1.7 mg/mL) Skin Prick Arm #1   Miralax Time Testing Placed 1005    Miralax Wheal Negative      Miralax (1:10 or 17mg /mL) Skin Prick Arm #1   Miralax Time Testing Placed 1028    Miralax Wheal Negative      Miralax (1:1 or 170mg /mL) Skin Prick Arm #1   Miralax Time Testing Placed 1049    Miralax Wheal Negative      INTRADERMAL TESTING - Arm #2   Location Right Arm;Left Arm      Control (negative - HSA) Intradermal Arm #2   Control Time Testing Placed  1028    Control Wheal Negative      Triamcinolone (1:100) Intradermal Arm #2   Triamcinolone Time Testing Placed 1028    Triamcinolone Wheal Negative      Methylprednisolone (1:100) Intradermal Arm #2   Methylprednisolone Time Testing Placed  1028    Methylprednisolone Wheal Negative      Triamcinolone (1:10) Intradermal Arm #2   Triamcinolone Time Testing Placed 1049    Triamcinolone Wheal Negative      Methylprednisolone (1:10) Intradermal Arm #2   Methylprednisolone Time Testing Placed  1049    Methylprednisolone Wheal Negative      Triamcinolone (1:1) Intradermal Arm #2   Triamcinolone Time Testing Placed 1106     Triamcinolone Wheal Negative      Skin Prick/Intradermal Post  Testing   Skin Prick/Intradermal Testing Total Pricks 13           Previous notes and tests were reviewed. The plan was reviewed with the patient/family, and all questions/concerned were addressed.  It was my pleasure to see Deliliah today and participate in her care. Please feel free to contact me with any questions or concerns.  Sincerely,  Rexene Alberts, DO Allergy & Immunology  [facility] office: [phone] [place] office: [phone]

## 2020-07-13 NOTE — Progress Notes (Signed)
° °  Covid-19 Vaccination Clinic  Name:  [name]    MRN: [id number] DOB: 03/13/1931  07/13/2020  [name] was observed post Covid-19 immunization for 30 minutes based on pre-vaccination screening without incident. She was provided with Vaccine Information Sheet and instruction to access the V-Safe system.   [name] was instructed to call 911 with any severe reactions post vaccine:  Difficulty breathing   Swelling of face and throat   A fast heartbeat   A bad rash all over body   Dizziness and weakness   Immunizations Administered    Name Date Dose VIS Date Route   Moderna COVID-19 Vaccine 07/13/2020 11:39 AM 0.5 mL 09/2019 Intramuscular   Manufacturer: Moderna   Lot: [id number]   [place]: [phone]

## 2020-08-06 ENCOUNTER — Ambulatory Visit (INDEPENDENT_AMBULATORY_CARE_PROVIDER_SITE_OTHER): Payer: Medicare Other

## 2020-08-06 ENCOUNTER — Other Ambulatory Visit: Payer: Self-pay

## 2020-08-06 DIAGNOSIS — Z23 Encounter for immunization: Secondary | ICD-10-CM

## 2020-08-06 NOTE — Progress Notes (Signed)
° °  Covid-19 Vaccination Clinic  Name:  [name]    MRN: [id number] DOB: 07-12-1931  08/06/2020  [name] was observed post Covid-19 immunization for 30 minutes based on pre-vaccination screening without incident. She was provided with Vaccine Information Sheet and instruction to access the V-Safe system.   [name] was instructed to call 911 with any severe reactions post vaccine:  Difficulty breathing   Swelling of face and throat   A fast heartbeat   A bad rash all over body   Dizziness and weakness   Immunizations Administered    Name Date Dose VIS Date Route   Moderna COVID-19 Vaccine 08/06/2020 10:41 AM 0.5 mL 09/2019 Intramuscular   Manufacturer: Moderna   Lot: [id number]   [place]ips: [phone]

## 2020-08-22 ENCOUNTER — Encounter: Payer: Self-pay | Admitting: *Deleted

## 2020-08-22 NOTE — Progress Notes (Signed)
[facility]    Provider:  Dr Jaynee Eagles Requesting Provider: Kathie Dike, MD Primary Care Provider:  Leighton Ruff, MD  CC:  Memory loss  HPI:  [name] is a 86 y.o. female here as requested by [facility] Jerilynn Mages* Dr. Drema Dallas for memory loss.  I reviewed Dr. Drema Dallas' notes: She has a past medical history of memory problems, postsurgical hypothyroidism, hypertension, hyperlipidemia, osteopenia.  She has high blood pressure but not checking her pressures at home, she has hyperlipidemia her diet is not as good as it could be, she likes bologna and cheese wraps, she usually does take out dinner including meat loaf mashed potato, does not like vegetables, has a history of thyroid cancer and is due for follow-up, patient's daughter accompanied her on the last visit and daughter was concerned that patient may be having some memory issues, patient has said that her husband is taking her things like her ID and her close, patient's husband says he is not but patient denies hallucinations.  CMP, TSH, thyroglobulin antibodies, CBC and B12 were ordered at this appointment June 19, 2020, TSH 26.63, BUN 12, creatinine 0.98, otherwise CMP unremarkable, CBC normal, thyroglobulin antibody less than 1, vitamin B12 296.   SHe lives with her husband, he thinks she is "going crazy", she has been married 60+ years, patient says she thinks he is crazy. She doesn't think she is losing her memory. She has called him "asshole" but ongoing for 40 years. She went to her primary care and they did a memory test. She does forget things. The kids don;t live there so unsure what is really going on. Son hasn;t seen anything that is severe, she will forget things, she nmay forget some conversations, She denies any memory loss.  She quit driving because of vision. She has a depth perception issue, she recognized she had to stop driving. She says her husband likes other women, this has been ongoing, he  takes things from her to give to other women. He came in one day upset, and felt sorry for the girls he works with and for examples with take her coat and give them to other people. He hasn't done it lately because she told him not to. Now she "cusses him out". Son doesn't notice any personality changes. He says no changes in personality.Father dies in 17s, mother was 17, no memory issues.    Was able to speak with son alone: She forgets things, she loses things, she asks the same questions over and over again, she acuses her husband of taking things and she gets them back. Anything that goes missing she acuses her husband of taking it, even the remote and then she denies she put it there. Son states no personality changes, she has always called her husband jack ass, the problem is the short-term memory loss and the denial that she is losing things. She does not take her medications. She is not eating well. She hasn't been to the doctor in years. Started years ago. She does not prepare meals but this may be a choice, she can still make a baked banana pudding, not cooking is by choice, she will eat ceral for breakfast and she will eat sandwhich for lunch. Poor diet. Poor insight and judgement.   Reviewed notes, labs and imaging from outside physicians, which showed: see above  Review of Systems: Patient complains of symptoms per HPI as well as the following symptoms: memory loss. Pertinent negatives and positives per HPI.  All others negative.   Social History   Socioeconomic History  . Marital status: Married    Spouse name: Not on file  . Number of children: 4  . Years of education: Not on file  . Highest education level: Associate degree: occupational, Hotel manager, or vocational program  Occupational History    Comment: retired  Tobacco Use  . Smoking status: Never Smoker  . Smokeless tobacco: Never Used  Vaping Use  . Vaping Use: Never used  Substance and Sexual Activity  . Alcohol use: Yes     Comment: rare, wine  . Drug use: Never  . Sexual activity: Not on file  Other Topics Concern  . Not on file  Social History Narrative   08/23/20 lives with husband   Caffeine- 8 oz day   Social Determinants of Health   Financial Resource Strain:   . Difficulty of Paying Living Expenses: Not on file  Food Insecurity:   . Worried About Charity fundraiser in the Last Year: Not on file  . Ran Out of Food in the Last Year: Not on file  Transportation Needs:   . Lack of Transportation (Medical): Not on file  . Lack of Transportation (Non-Medical): Not on file  Physical Activity:   . Days of Exercise per Week: Not on file  . Minutes of Exercise per Session: Not on file  Stress:   . Feeling of Stress : Not on file  Social Connections:   . Frequency of Communication with Friends and Family: Not on file  . Frequency of Social Gatherings with Friends and Family: Not on file  . Attends Religious Services: Not on file  . Active Member of Clubs or Organizations: Not on file  . Attends Archivist Meetings: Not on file  . Marital Status: Not on file  Intimate Partner Violence:   . Fear of Current or Ex-Partner: Not on file  . Emotionally Abused: Not on file  . Physically Abused: Not on file  . Sexually Abused: Not on file    Family History  Problem Relation Age of Onset  . Cancer Mother        breast  . Stroke Mother   . Heart disease Father   . Cancer Father        lung  . Asthma Father   . Allergic rhinitis Father     Past Medical History:  Diagnosis Date  . Allergy   . Bleeding nose   . Hyperlipidemia   . Hypertension   . Kidney stones   . Memory problem   . Osteopenia   . Postsurgical hypothyroidism   . Thyroid cancer [facility])    papillary    Patient Active Problem List   Diagnosis Date Noted  . Memory loss 08/26/2020  . Drug reaction 07/13/2020  . Thyroid cancer, multifocal papillary thryroid cancer 06/18/2011    Past Surgical History:    Procedure Laterality Date  . ABDOMINAL HYSTERECTOMY     partial  . APPENDECTOMY    . BACK SURGERY     rod and screws  . BREAST SURGERY     augmentation  . CARDIAC CATHETERIZATION    . CATARACT EXTRACTION     both  . EYE SURGERY     both  . LAMINECTOMY    . NASAL SEPTUM SURGERY    . OOPHORECTOMY    . SHOULDER SURGERY     left and right  . SINUS IRRIGATION    . SUBACROMIAL DECOMPRESSION    .  THYROIDECTOMY  2012    Current Outpatient Medications  Medication Sig Dispense Refill  . acetaminophen (TYLENOL) 325 MG tablet Take 2 tablets (650 mg total) by mouth every 6 (six) hours as needed. 15 tablet 0  . bisoprolol (ZEBETA) 10 MG tablet Take 5 mg by mouth 2 (two) times daily.     . RESTASIS 0.05 % ophthalmic emulsion Place 1 drop into both eyes as needed.     Marland Kitchen SYNTHROID 75 MCG tablet Take 75 mcg by mouth daily.    . valsartan-hydrochlorothiazide (DIOVAN-HCT) 160-25 MG per tablet Take 1 tablet by mouth daily.       No current facility-administered medications for this visit.    Allergies as of 08/23/2020 - Review Complete 08/22/2020  Allergen Reaction Noted  . Iodinated diagnostic agents Anaphylaxis 02/11/2011  . Bextra [valdecoxib] Nausea And Vomiting 05/14/2011  . Ceclor [cefaclor] Nausea And Vomiting 05/14/2011  . Celebrex [celecoxib] Nausea And Vomiting 05/14/2011  . Codeine Other (See Comments) 05/14/2011  . Demerol  05/14/2011  . Doxycycline Nausea And Vomiting 05/14/2011  . Erythromycin Nausea And Vomiting 05/14/2011  . Meloxicam Nausea And Vomiting 05/14/2011  . Morphine and related Nausea And Vomiting and Other (See Comments) 05/14/2011  . Penicillins Nausea And Vomiting 05/14/2011  . Scopolamine  05/14/2011  . Sulfur Nausea And Vomiting 05/14/2011  . Tramadol Nausea And Vomiting 05/14/2011  . Vicodin [hydrocodone-acetaminophen]  05/14/2011  . Vioxx [rofecoxib] Nausea And Vomiting 05/14/2011  . Quinine derivatives Rash 05/14/2011    Vitals: BP (!) 145/71    Pulse (!) 53   Ht 5\' 2"  (1.575 m)   Wt 142 lb (64.4 kg)   BMI 25.97 kg/m  Last Weight:  Wt Readings from Last 1 Encounters:  08/23/20 142 lb (64.4 kg)   Last Height:   Ht Readings from Last 1 Encounters:  08/23/20 5\' 2"  (1.575 m)     Physical exam: Exam: Gen: NAD, conversant, well nourised, well groomed                     CV: RRR, no MRG. No Carotid Bruits. No peripheral edema, warm, nontender Eyes: Conjunctivae clear without exudates or hemorrhage  Neuro: Detailed Neurologic Exam  Speech:    Speech is normal; fluent and spontaneous with normal comprehension.  Cognition:  MMSE - Mini Mental State Exam 08/23/2020  Orientation to time 5  Orientation to Place 2  Registration 3  Attention/ Calculation 5  Recall 2  Language- name 2 objects 2  Language- repeat 0  Language- follow 3 step command 3  Language- read & follow direction 1  Write a sentence 1  Copy design 1  Total score 25   Cranial Nerves:    The pupils are equal, round, and reactive to light. The fundi are normal and spontaneous venous pulsations are present. Visual fields are full to finger confrontation. Extraocular movements are intact. Trigeminal sensation is intact and the muscles of mastication are normal. The face is symmetric. The palate elevates in the midline. Hearing intact. Voice is normal. Shoulder shrug is normal. The tongue has normal motion without fasciculations.   Coordination:    No dysmetria or ataxia Gait:    Normal native gait  Motor Observation:    No asymmetry, no atrophy, and no involuntary movements noted. Tone:    Normal muscle tone.    Posture:    Posture is normal. normal erect    Strength:    Strength is intact and symmetricin the upper and lower  limbs.      Sensation: intact to LT     Reflex Exam:  DTR's:    Deep tendon reflexes in the upper and lower extremities are normal bilaterally.   Toes:    The toes are downgoing bilaterally.   Clonus:    Clonus is  absent.    Assessment/Plan:  86 y.o. female here as requested by [facility] Mages* Dr. Drema Dallas for memory loss.  I reviewed Dr. Drema Dallas' notes: She has a past medical history of memory problems, postsurgical hypothyroidism, hypertension, hyperlipidemia, osteopenia. MMSE 25/30. Need to be very cautious with patient, had to discuss with son alone because she denies any memory issues whatsoever and gets upset. She has memory loss and delusions. She insists her husband keeps giving away her clothing for example, but she always gets it back after she asks him to retrieve it from the women he gives it to.   MRI of the brain: to look for reversible causes of dementia Blood work Formal memory testing If needed, FDG PET Scan Follow up with Dr. Jaynee Eagles after completed above to discuss medication management and future treatment if warranted  Labs: TSH 26.63(follow with Dr. Drema Dallas), BUN 12, creatinine 0.98, otherwise CMP unremarkable, CBC normal, thyroglobulin antibody less than 1, vitamin B12 296(low-normal, recheck with MMA).  Orders Placed This Encounter  Procedures  . MR BRAIN W WO CONTRAST  . B12 and Folate Panel  . Methylmalonic acid, serum  . Vitamin B1  . Homocysteine  . Basic Metabolic Panel  . Ambulatory referral to Neuropsychology   No orders of the defined types were placed in this encounter.   Cc: College, [facility], MD  Sarina Ill, MD  [facility] [address] [place], [place] [address]  Phone [phone] Fax [phone]  I spent over 60 minutes of face-to-face and non-face-to-face time with patient on the  1. Memory loss   2. Major neurocognitive disorder ([place])   3. Delusions ([place])    diagnosis.  This included previsit chart review, lab review, study review, order entry, electronic health record documentation, patient education on the different diagnostic and therapeutic options, counseling and coordination  of care, risks and benefits of management, compliance, or risk factor reduction

## 2020-08-23 ENCOUNTER — Ambulatory Visit: Payer: Medicare Other | Admitting: Neurology

## 2020-08-23 ENCOUNTER — Encounter: Payer: Self-pay | Admitting: Neurology

## 2020-08-23 VITALS — BP 145/71 | HR 53 | Ht 62.0 in | Wt 142.0 lb

## 2020-08-23 DIAGNOSIS — F0390 Unspecified dementia without behavioral disturbance: Secondary | ICD-10-CM

## 2020-08-23 DIAGNOSIS — R413 Other amnesia: Secondary | ICD-10-CM | POA: Diagnosis not present

## 2020-08-23 DIAGNOSIS — F22 Delusional disorders: Secondary | ICD-10-CM | POA: Diagnosis not present

## 2020-08-23 NOTE — Patient Instructions (Addendum)
MRI of the brain Blood work Formal Public house manager Strategies  1. Use "WARM" strategy.  W= write it down  A= associate it  R= repeat it  M= make a mental note  2.   You can keep a Social worker.  Use a 3-ring notebook with sections for the following: calendar, important names and phone numbers,  medications, doctors' names/phone numbers, lists/reminders, and a section to journal what you did  each day.   3.    Use a calendar to write appointments down.  4.    Write yourself a schedule for the day.  This can be placed on the calendar or in a separate section of the Memory Notebook.  Keeping a  regular schedule can help memory.  5.    Use medication organizer with sections for each day or morning/evening pills.  You may need help loading it  6.    Keep a basket, or pegboard by the door.  Place items that you need to take out with you in the basket or on the pegboard.  You may also want to  include a message board for reminders.  7.    Use sticky notes.  Place sticky notes with reminders in a place where the task is performed.  For example: " turn off the  stove" placed by the stove, "lock the door" placed on the door at eye level, " take your medications" on  the bathroom mirror or by the place where you normally take your medications.  8.    Use alarms/timers.  Use while cooking to remind yourself to check on food or as a reminder to take your medicine, or as a  reminder to make a call, or as a reminder to perform another task, etc.

## 2020-08-24 ENCOUNTER — Telehealth: Payer: Self-pay

## 2020-08-24 NOTE — Telephone Encounter (Signed)
[name] is a 86 y.o. female was contacted and notified of the message below. Pt has no additional questions or concerns.

## 2020-08-24 NOTE — Telephone Encounter (Signed)
-----   Message from Melvenia Beam, MD sent at 08/24/2020  8:46 AM EDT ----- So far blood work looks fine, we will call if anything comes back abnormal thanks

## 2020-08-26 ENCOUNTER — Encounter: Payer: Self-pay | Admitting: Neurology

## 2020-08-26 DIAGNOSIS — R413 Other amnesia: Secondary | ICD-10-CM | POA: Insufficient documentation

## 2020-08-27 ENCOUNTER — Telehealth: Payer: Self-pay | Admitting: Neurology

## 2020-08-27 NOTE — Telephone Encounter (Signed)
[employer] medicare order sent to GI. No auth they will reach out to the patient to schedule.  

## 2020-08-28 ENCOUNTER — Telehealth: Payer: Self-pay | Admitting: Neurology

## 2020-08-28 ENCOUNTER — Encounter: Payer: Self-pay | Admitting: Counselor

## 2020-08-28 NOTE — Telephone Encounter (Signed)
Pt's daughter [name] called stating that the pt is allergic to contrast and is needing to speak to the RN about the MRI that was ordered for the pt with contrast. She states that GI will not schedule pt until daughter speaks to RN or Provider. Please advise.

## 2020-08-29 ENCOUNTER — Other Ambulatory Visit: Payer: Self-pay | Admitting: Neurology

## 2020-08-29 MED ORDER — PREDNISONE 50 MG PO TABS: ORAL_TABLET | ORAL | 0 refills | Status: DC

## 2020-08-29 NOTE — Telephone Encounter (Signed)
I will call her in steroids that she takes starting 13 hours prior to imaging. Directions will be in prescription. thanks

## 2020-08-29 NOTE — Telephone Encounter (Signed)
50mg  prednisone 13,7 and 1 hour before procedure. Take one dose of 50mg  of benadryl with the prednisone 1 hour before procedure.

## 2020-08-30 ENCOUNTER — Other Ambulatory Visit: Payer: Self-pay | Admitting: *Deleted

## 2020-08-30 NOTE — Telephone Encounter (Signed)
I will call [facility] and see.. can you put a new order in for [place]?

## 2020-08-30 NOTE — Telephone Encounter (Signed)
Noted thanks. I went ahead and reached out to the daughter Karna Christmas and let her know the plan to do MRI without contrast and that if there is anything suspicious seen we can always order with contrast if needed. I also let her know that I was going to cancel the Prednisone prescription at the pharmacy to avoid any potential confusion. GI will call her to schedule. She verbalized appreciation.

## 2020-08-30 NOTE — Telephone Encounter (Signed)
I called the pt's daughter Karna Christmas to discuss the steroids and Benadryl for the contrast prep. Terri told me that in 1974/75 the patient received IVP dye and went into cardiac arrest. She has an allergy to iodine and shellfish. Terri stated the pt has only had contrast dye one other time and that was at [facility]. They prepped her with medication and had anesthesia on standby but she is unsure what medications were given. I let her know that I would provide this additional information to Dr Jaynee Eagles and we discussed that the typical contrast allergy prep that we order, which has already been called into the pharmacy, is Prednisone 50 mg at 13 hours, 7, hours, and 1 hour prior to MRI and Benadryl 50 mg just 1 hour prior to MRI. However I told her to hold on picking the steroids up until the MRI is scheduled and until she hears from our office. She verbalized understanding and appreciation.

## 2020-08-30 NOTE — Progress Notes (Signed)
MRI will be WITHOUT contrast now. Will reorder if needed. I called [facility] pharmacy and canceled the Prednisone prescription.

## 2020-08-30 NOTE — Telephone Encounter (Signed)
I don;t believe there is Iodine in [place] but maybe we can have it completed at [facility] to be safe. [name] can we do that?

## 2020-08-30 NOTE — Addendum Note (Signed)
Addended by: Sarina Ill B on: 08/30/2020 12:13 PM   Modules accepted: Orders

## 2020-08-30 NOTE — Telephone Encounter (Signed)
I reviewed my note, I think we can just go ahead with MRI WITHOUT contrast. If I see anything suspicious we can always order with contrast IF needed. I think without will be fine thanks.

## 2020-08-30 NOTE — Telephone Encounter (Signed)
Noted, I will send the order to GI they will reach out to the patient to schedule.

## 2020-09-03 LAB — BASIC METABOLIC PANEL
BUN/Creatinine Ratio: 18 (ref 12–28)
BUN: 18 mg/dL (ref 8–27)
CO2: 22 mmol/L (ref 20–29)
Calcium: 10 mg/dL (ref 8.7–10.3)
Chloride: 98 mmol/L (ref 96–106)
Creatinine, Ser: 0.99 mg/dL (ref 0.57–1.00)
GFR calc Af Amer: 59 mL/min/{1.73_m2} — ABNORMAL LOW (ref >59)
GFR calc non Af Amer: 51 mL/min/{1.73_m2} — ABNORMAL LOW (ref >59)
Glucose: 98 mg/dL (ref 65–99)
Potassium: 4 mmol/L (ref 3.5–5.2)
Sodium: 140 mmol/L (ref 134–144)

## 2020-09-03 LAB — B12 AND FOLATE PANEL
Folate: 7.1 ng/mL (ref >3.0)
Vitamin B-12: 504 pg/mL (ref 232–1245)

## 2020-09-03 LAB — VITAMIN B1: Thiamine: 116 nmol/L (ref 66.5–200.0)

## 2020-09-03 LAB — HOMOCYSTEINE: Homocysteine: 13.4 umol/L (ref 0.0–21.3)

## 2020-09-03 LAB — METHYLMALONIC ACID, SERUM: Methylmalonic Acid: 258 nmol/L (ref 0–378)

## 2020-09-09 ENCOUNTER — Other Ambulatory Visit: Payer: Self-pay

## 2020-09-09 ENCOUNTER — Ambulatory Visit
Admission: RE | Admit: 2020-09-09 | Discharge: 2020-09-09 | Disposition: A | Discharge status: Home / Self Care | Payer: Medicare Other | Source: Ambulatory Visit | Attending: Neurology | Admitting: Neurology

## 2020-09-09 DIAGNOSIS — R413 Other amnesia: Secondary | ICD-10-CM | POA: Diagnosis not present

## 2020-09-09 DIAGNOSIS — F22 Delusional disorders: Secondary | ICD-10-CM

## 2020-09-09 DIAGNOSIS — F0390 Unspecified dementia without behavioral disturbance: Secondary | ICD-10-CM

## 2020-09-10 ENCOUNTER — Telehealth: Payer: Self-pay | Admitting: *Deleted

## 2020-09-10 NOTE — Telephone Encounter (Addendum)
Spoke with pt's son [name] and discussed the MRI brain results as noted below by Dr Jaynee Eagles. He verbalized understanding and his questions were answered. Pt has an appt already with Dr Nicole Kindred scheduled for 10/30/19. I advised once all of the testing has completed pt will follow-up here in the office. Tentatively scheduled for May, however may be able to move up depending upon completion of testing. He was appreciative for the call.   ----- Message from Melvenia Beam, MD sent at 09/10/2020  8:57 AM EST ----- Please review with son on;y. 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 In order to confirm please proceed to the formal memory testing as we cannot rule out other concomitant processes also occuring like alzheimers. thanks

## 2020-10-29 ENCOUNTER — Encounter: Payer: Medicare Other | Admitting: Counselor

## 2020-11-06 ENCOUNTER — Encounter: Payer: Medicare Other | Admitting: Counselor

## 2020-12-03 ENCOUNTER — Encounter: Payer: Medicare Other | Admitting: Counselor

## 2020-12-13 ENCOUNTER — Encounter: Payer: Medicare Other | Admitting: Counselor

## 2021-02-20 ENCOUNTER — Encounter: Payer: Medicare Other | Admitting: Counselor

## 2021-02-26 ENCOUNTER — Encounter: Payer: Medicare Other | Admitting: Counselor

## 2021-02-27 ENCOUNTER — Ambulatory Visit: Payer: Medicare Other | Admitting: Adult Health

## 2021-06-24 ENCOUNTER — Ambulatory Visit: Payer: Medicare Other | Admitting: Psychology

## 2021-06-24 ENCOUNTER — Encounter: Payer: Self-pay | Admitting: Counselor

## 2021-06-24 ENCOUNTER — Ambulatory Visit (INDEPENDENT_AMBULATORY_CARE_PROVIDER_SITE_OTHER): Payer: Medicare Other | Admitting: Counselor

## 2021-06-24 ENCOUNTER — Other Ambulatory Visit: Payer: Self-pay

## 2021-06-24 DIAGNOSIS — F09 Unspecified mental disorder due to known physiological condition: Secondary | ICD-10-CM

## 2021-06-24 DIAGNOSIS — F0390 Unspecified dementia without behavioral disturbance: Secondary | ICD-10-CM | POA: Diagnosis not present

## 2021-06-24 DIAGNOSIS — R413 Other amnesia: Secondary | ICD-10-CM

## 2021-06-24 DIAGNOSIS — R4189 Other symptoms and signs involving cognitive functions and awareness: Secondary | ICD-10-CM

## 2021-06-24 NOTE — Progress Notes (Signed)
[facility]  Patient Name: [name] MRN: [id number] Date of Birth: 01/11/1931 Age: 87 y.o. Education: 13 years  Referral Circumstances and Background Information  Ms. [name] is a 87 y.o., right-hand dominant, married woman with a history of thyroid cancer and postsurgical hypothyroidism, HTN, HLD, and memory and thinking problems. She was referred by my neurology colleague Dr. Sarina Ill at [facility] who noted an MMSE of 25/30, memory loss, delusions, and anosognosia. She was referred for neuropsychological evaluation in the service of diagnostic clarification.   On interview, the patient reported that she doesn't think she has problems with memory and thinking. She is 87 years old and may forget things, although she doesn't think it's a big deal. She thinks that her real problem is her husband, who "likes to make it look like I am an idiot so that people will pity him." With respect to mood, she reported that her spirits are usually fairly good. She enjoys spending time with her dog, Elyse Hsu. Her appetite is good and she denied gaining or losing any significant amount of weight. She reported that she sleeps well and estimated that she usually gets around 8 or 9 hours. She has no dream enactment behavior. She reported that her energy is fairly good.   I was able to speak with the patient's son [name] apart from the patient, after she began testing. He stated that her problems started about 4 years ago with memory loss and have been progressive over time. The cognitive changes are significant and at present, she has rapid forgetting of information, she told her son the same story twice within the span of 10 minutes on the way up here. He reported that there has always been a high level of discord in the patient's marriage, which continues. She blames her memory problems on conflict with her husband and there is a lot of paranoia. She thinks that he is stealing  things from her, her son stated that he did take things from her in the past and would hide them, but he does not now. He apparently contributes to the marital discord by getting angry at her when she forgets things or acuses him of taking things. On specific review of symptoms, her son denied that she is having any new hallucinations, although she does apparently think she can see spirits. She has some word finding problems but they aren't clearly worse than others her age. With respect to orientation, she does well with year but her son thinks she may miss the date.   With respect to functioning, the patient reported that she doesn't do very many things. She does do crossword puzzles and watches TV. She has stopped cooking, which occurred many years ago. With respect to driving, the patient stopped many years ago and hasn't in fact driven in probably 20 years or so. She doesn't cook anymore but he thinks that she could, she has made some deserts and can participate when others cook. The patient and her husband tend to order out a lot. The patient was apparently having a hard time managing the finances, she had lost some checks and also had more money than she thought she did by several thousand dollars. Her husband decided to take the finances back over several months ago. She is reliable with her medications. She is generally fairly good with her doctors appointments, she uses a calendar to track them. The patient essentially never leaves the house, unless she is  going to the doctors, since [place]. She has a final booster shot and they are trying to get her out of the house more.   Past Medical History and Review of Relevant Studies   Patient Active Problem List   Diagnosis Date Noted   Memory loss 08/26/2020   Drug reaction 07/13/2020   Thyroid cancer, multifocal papillary thryroid cancer 06/18/2011   Review of Neuroimaging and Relevant Medical History: The patient has an MRI of the brain from  09/09/2020 that shows moderate ventriculomegaly and mesial temporal volume loss and mild volume loss elsewhere considering her age. While her ventriculomegaly is somewhat more than I sometimes expect to see given the extent of cortical volume loss, that could reflect central volume loss due to extensive periventricular and subcortical leukoaraiosis. The extent of leukoaraiosis is moderate and it is confluent, enough to cause a dementia level of function in and of itself. Some areas of chronic microhemorrhage in the bilateral thalamus may reflect hypertensive hemorrhage. The imaging suggests Alzheimer's and vascular causes as possible etiologies.   Current Outpatient Medications  Medication Sig Dispense Refill   acetaminophen (TYLENOL) 325 MG tablet Take 2 tablets (650 mg total) by mouth every 6 (six) hours as needed. 15 tablet 0   bisoprolol (ZEBETA) 10 MG tablet Take 5 mg by mouth 2 (two) times daily.      RESTASIS 0.05 % ophthalmic emulsion Place 1 drop into both eyes as needed.      SYNTHROID 75 MCG tablet Take 75 mcg by mouth daily.     valsartan-hydrochlorothiazide (DIOVAN-HCT) 160-25 MG per tablet Take 1 tablet by mouth daily.       No current facility-administered medications for this visit.    Family History  Problem Relation Age of Onset   Cancer Mother        breast   Stroke Mother    Heart disease Father    Cancer Father        lung   Asthma Father    Allergic rhinitis Father    There is no  family history of dementia. There is no  family history of psychiatric illness.  Psychosocial History  Developmental, Educational and Employment History: The patient reported that she did very well in school and was in fact the valedictorian of her class. She graduated with a 98.4 average. She went on to study one year at [employer], general courses, but she decided to quit because she wanted to be an Research officer, political party. She worked for a time, then she stayed at home to help raise children, and then  she went back and got a position at [employer]. 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 She has been retired for many years.   Psychiatric History: The patient denied that she has any history of psychiatric difficulties or treatment.   Substance Use History: The patient denied any alcohol use, nicotine use, and she doesn't use any illicit drugs.   Relationship History and Living Cimcumstances: The patient and her husband have been married for 19 years. They have four children.   Mental Status and Behavioral Observations  Sensorium/Arousal: The patient's level of arousal was awake and alert. Hearing and vision were adequate for testing purposes. Orientation: The patient was generally oriented to person, place, time, and situation although she was off on the date and reported it was the 7th.  Appearance: Dressed in appropriate, casual clothing with reasonable grooming and hygiene Behavior: The patient was  quite animated, somewhat agitated, and was defensive during history taking to the extent that I interviewed her son apart from her (with her assent). She was otherwise appropriate and persisted through the testing as per technician.  Speech/language: Normal in rate, rhythm, volume and prosody.  Gait/Posture: The patient's gait was not formally examined.  Movement: No Parkinsonism on exam with Dr. Jaynee Eagles.  Social Comportment: Agitated intermittently at history taking and the suggestion that there may be something wrong Mood: "My problem is my husband" Affect: Agitated Thought process/content: Thought process was circumstantial to tangential, requiring redirection to stay on task. The patient's thought content was fixated on her perception that her husband is the source of her problems.  Safety: No thoughts of harming self or others noted during today's encounter Insight: Fair    Test Procedures  Wide Range Achievement Test - 4             Word  Reading Neuropsychological Assessment Battery  List Learning  Story Learning  Daily Living Memory  Naming  Digit Span Repeatable Battery for the Assessment of Neuropsychological Status (Form A)  Figure Copy  Judgment of Line Orientation  Coding  Figure Recall The Dot Counting Test A Random Letter Test Controlled Oral Word Association (F-A-S) Semantic Fluency (Animals) Trail Making Test A & B Complex Ideational Material Modified [place] Card Sorting Test Geriatric Depression Scale - Short Form Quick Dementia Rating System (completed by son, [name])  Plan  JORDYNNE FREEMAN was seen for a psychiatric diagnostic evaluation and neuropsychological testing. She is an 87 year old, right-hand dominant, married woman with a history of postsurgical hypothyroidism, HTN, HLD, and memory and thinking problems. She has poor insight and does not think anything is wrong. She is here with her son today who has noticed progressive decline over the past 4 years. The patient's psychosocial circumstances are significantly colored by her relationship with her husband, it sounds as though there are some longstanding issues and now, she is fixated on him and blames him for all of her memory problems. She has some delusions as well thinking that he is stealing her things and giving them to other women. Imaging and preliminary review of cognitive testing is concerning for Alzheimer's. Full and complete note with impressions, recommendations, and interpretation of test data to follow.   Viviano Simas Nicole Kindred, PsyD, [place] Clinical Neuropsychologist  Informed Consent  Risks and benefits of the evaluation were discussed with the patient prior to all testing procedures. I conducted a clinical interview and neuropsychological testing (at least two tests) with Caro Laroche and Milana Kidney, B.S. (Technician) administered additional test procedures. The patient was able to tolerate the testing procedures and the patient (and/or  family if applicable) is likely to benefit from further follow up to receive the diagnosis and treatment recommendations, which will be rendered at the next encounter.

## 2021-06-24 NOTE — Progress Notes (Signed)
   Psychometrist Note   Cognitive testing was administered to [name] by Milana Kidney, B.S. (Technician) under the supervision of Alphonzo Severance, Psy.D., ABN. Ms. Mandujano was able to tolerate all test procedures. Dr. Nicole Kindred met with the patient as needed to manage any emotional reactions to the testing procedures. Rest breaks were offered.    The battery of tests administered was selected by Dr. Nicole Kindred with consideration to the patient's current level of functioning, the nature of her symptoms, emotional and behavioral responses during the interview, level of literacy, observed level of motivation/effort, and the nature of the referral question. This battery was communicated to the psychometrist. Communication between Dr. Nicole Kindred and the psychometrist was ongoing throughout the evaluation and Dr. Nicole Kindred was immediately accessible at all times. Dr. Nicole Kindred provided supervision to the technician on the date of this service, to the extent necessary to assure the quality of all services provided.    [name] will return in approximately one week for an interactive feedback session with Dr. Nicole Kindred, at which time test performance, clinical impressions, and treatment recommendations will be reviewed in detail. The patient understands she can contact our office should she require our assistance before this time.   A total of 95 minutes of billable time were spent with [name] by the technician, including test administration and scoring time. Billing for these services is reflected in Dr. Les Pou note.   This note reflects time spent with the psychometrician and does not include test scores, clinical history, or any interpretations made by Dr. Nicole Kindred. The full report will follow in a separate note.

## 2021-06-25 ENCOUNTER — Encounter: Payer: Self-pay | Admitting: Counselor

## 2021-06-25 NOTE — Progress Notes (Signed)
[facility]  Patient Name: [name] MRN: [id number] Date of Birth: 01/07/1931 Age: 87 y.o. Education: 13  Measurement properties of test scores: IQ, Index, and Standard Scores (SS): Mean = 100; Standard Deviation = 15 Scaled Scores (Ss): Mean = 10; Standard Deviation = 3 Z scores (Z): Mean = 0; Standard Deviation = 1 T scores (T); Mean = 50; Standard Deviation = 10  TEST SCORES:    Note: This summary of test scores accompanies the interpretive report and should not be interpreted by unqualified individuals or in isolation without reference to the report. Test scores are relative to age, gender, and educational history as available and appropriate.   Performance Validity        "A" Random Letter Test Raw  Descriptor      Errors 0 Within Expectation  The Dot Counting Test: 9 Within Expectation  NAB Effort Index 3 Below Expectation      Mental Status Screening     Total Score Descriptor  MoCA 18 Mild Dementia      Expected Functioning        Wide Range Achievement Test: Standard/Scaled Score Percentile      Word Reading 107 68      Attention/Processing Speed        Neuropsychological Assessment Battery (Attention Module, Form 1): T-score Percentile      Digits Forward 45 31      Digits Backwards 45 31      Repeatable Battery for the Assessment of Neuropsychological Status (Form A): Scaled Score Percentile      Coding 11 63      Language        Neuropsychological Assessment Battery (Language Module, Form 1): T-score Percentile      Naming   (29) 53 62      Verbal Fluency: T-score Percentile      Controlled Oral Word Association (F-A-S) 36 8      Semantic Fluency (Animals) 29 2      Memory:        Neuropsychological Assessment Battery (Memory Module, Form 1): T-score Percentile      List Learning           List A Immediate Recall   (3, 4, 4) 30 2         List B Immediate Recall   (3) 45 31         List A Short Delayed  Recall   (2) 30 2         List A Long Delayed Recall   (0) 29 2         List A Percent Retention   (0 %) --- 2         List A Long Delayed Yes/No Recognition Hits   (7) --- 1         List A Long Delayed Yes/No Recognition False Alarms   (7) --- 34         List A Recognition Discriminability Index --- 7      Story Learning           Immediate Recall   (17, 23) 33 5         Delayed Recall   (9) 30 2         Percent Retention   (39 %) --- 5      Daily Living Memory            Immediate Recall   (  19, 11) 37 9          Delayed Recall   (3, 0) 19 <1          Percent Retention (27 %) --- <1          Recognition Hits    (5) --- 2      Repeatable Battery for the Assessment of Neuropsychological Status (Form A): Scaled Score Percentile         Figure Recall   (2) 3 1      Visuospatial/Constructional Functioning        Repeatable Battery for the Assessment of Neuropsychological Status (Form A): Standard/Scaled Score Percentile     Visuospatial/Constructional Index 72 3         Figure Copy   (15) 6 9         Judgment of Line Orientation   (11) --- 3-9      Executive Functioning        Modified [employer] Test (MWCST): Standard/T-Score Percentile      Number of Categories Correct 64 92      Number of Perseverative Errors 72 99      Number of Total Errors 58 79      Percent Perseverative Errors 67 96  Executive Function Composite 130 98      Trail Making Test: T-Score Percentile      Part A 43 25      Part B 52 58      [place] Diagnostic Aphasia Exam: Raw Score Scaled Score      Complex Ideational Material 11 9      Clock Drawing Raw Score Descriptor      Command 9 WNL      Rating Scales        Clinical Dementia Rating Raw Score Descriptor      Sum of Boxes 5.5 Mild Dementia      Global Score 1.0 Mild Dementia      Quick Dementia Rating System Raw Score Descriptor      Sum of Boxes 4.5 Mild Dementia      Total Score 7 Mild Dementia  Geriatric Depression Scale -  Short Form 4 Negative    Ching Rabideau V. Nicole Kindred PsyD, [place] Clinical Neuropsychologist

## 2021-06-26 NOTE — Progress Notes (Signed)
[facility]  Patient Name: [name] MRN: [id number] Date of Birth: 10/23/1931 Age: 87 y.o. Education: 89 years  Clinical Impressions  [name] is a 87 y.o., right-hand dominant, married woman with a history of postsurgical hypothyroidism, HTN, HLD, and memory and thinking problems. She has 85 poor insight and blames her problems on her husband. She has some related paranoia and thinks that he is stealing 85 the things that she is losing. Her son [name] provided collateral history and status information and has noticed progressive decline over about the past 4 years. It was subtle at first and is more noticeable now. She does have rapid forgetting of information and she is constantly blaming it on her husband when she misplaces things. Apparently, her husband would make jokes in bad taste historically, such as hiding her wedding ring and then asking her where it was. MRI of the brain shows an extensive burden of confluent leukoaraiosis in the periventricular and subcortical cerebral white matter as well as some shrinkage, including disproportionate volume loss in the temporal lobes.   On neuropsychological assessment, there is objective evidence of less than expected memory performance for a patient of her presumed ability with a profile concerning for storage issues. There are scattered additional low scores on measures of semantic verbal fluency and visuospatial/constructional abilities. By contrast, executive function was good and she also did well on measures of attention and processing speed. She screened negative for the presence of anxiety and depression on self-rating scales and her son rated her as functioning at a mild dementia level. I was able to rate a CDR for her, which falls in the mild dementia range.   [name] is demonstrating a dementia level problem, and lieu of her advanced age, disproportionate mesial temporal volume loss, and  neuropsychological test findings, Alzheimer's presents as the most likely etiology. She certainly has more than enough vascular disease to contribute on her scan, although she has no "frontal-subcortical dysfunction" with respect to her test data. The delusions of stealing that she reports, while a bit more extreme than sometimes seen, are classic for AD. I will discuss with the patient and her family means of enhancing communication, because the problematic dynamic between her and her husband is making things worse. She may benefit from a trial of antidementia medication. May benefit from compensatory strategies and the like, which we will also discuss.  Diagnostic Impressions: Alzheimer's dementia, late onset, with behavioral disturbance Anosognosia  Recommendations to be discussed with patient  Your performance and presentation on assessment were consistent with a memory storage problem. That is, you had difficulties acquiring and retaining information across time. This was for both verbal and visual information. You also had some scattered low scores in other areas, but that is the most important finding diagnostically. I think that these findings in the context of your clinical history and the fact that you are having some functional difficulties day to day suggest that dementia is the best diagnosis at this point in time.  Dementia refers to a group of syndromes where multiple areas of ability are damaged in the brain, such as memory, thinking, judgment, and behavior, and most commonly refers to age related causes of dementia that cause worsening in these abilities over time. Alzheimer's disease is the most common form of dementia in people over the age of 43. Not all dementias are due to Alzheimer's disease, but all Alzheimer's disease eventually results in dementia. When dementia is due to an underlying  condition affecting the brain such as Alzheimer's disease, there is progression over time, which  typically proceeds gradually over many years.   In your case, I have a high level of confidence that your dementia is at least partly due to Alzheimer's disease. This is the most common cause of dementia in individuals your age, it also goes well with your test data, and you also have shrinkage in areas of the brain on your MRI that is often associated with Alzheimer's disease. This is not necessarily the only cause of your decline, however, because you also have an extensive burden of subcortical vascular disease ("wear and tear" on old blood vessels within the brain). This may also be contributing.  Cerebrovascular disease is a major contributor to cognitive decline. Most people are familiar with stroke, which involves acute disruptions in blood flow to the brain that result in damage to underlying brain tissue. Small vessel ischemic disease is less well known but is more common and can be equally problematic. Small vessel ischemic disease involves wear and tear on small blood vessels in the brain over time and the frequency of this condition increases with age, affecting nearly 100% of people in those older than 85 years. Small vessel ischemic disease increases risk for dementia and imaging findings often attributed to small vessel disease have been associated with Alzheimer's dementia in some studies. The best way to minimize cerebrovascular risk is to actively manage risk factors, including hypertension and high cholesterol 87 Medications can be helpful but maintaining a healthy body weight, getting regular exercise, and eating a heart-healthy diet such as the MIND diet or DASH diet are also crucially important.   The mainstay of treatment for individuals with dementia is assistance as needed with activities of daily living. This usually includes complex things such as managing finances in the early stages. As the disease progresses, people may need increasing assistance with things such as venturing out  into the community to get food or other necessary items, with meal planning, keeping of personal effects and the like. Individuals with dementia typically need assistance with any memory dependent activities such as remembering to take medications, keeping track of doctors appointments, and other social engagements. The more these things can be built into routines that are simple, predictable and easy to follow, the better. Activities with a high risk of adverse outcomes (e.g., driving) need to be carefully monitored, because at some point in the disease progression, it becomes unsafe to drive.   Instead of medications, I recommend behavioral strategies for dealing with the behavioral and psychological issues that can accompany dementia. Things like agitation, wandering, and anxiety can often be improved or eliminated using the "three R's." Redirection (help distract your loved one by focusing their attention on something else, moving them to a new environment, or otherwise engaging them in something other than what is distressing to them), Reassurance (reassure them that you are there to take care of them and that there is nothing they need to be worried about), and Reconsidering (consider the situation from their perspective and try to identify if there is something about the situation or environment that may be triggering their reaction).  While medications can sometimes be helpful, oftentimes common sense, patience, and knowledge are the best tools for managing problematic behaviors in dementia. Every individual responds differently, but there are some general recommendations that may be helpful.  Provide assistance as needed with memory-dependent or other cognitively challenging activities (e.g., doctors appointments, medications, planning trips, complex chores,  paperwork or other forms).  Use simple, clear words that are easy to understand. Simplify information, be clear and concise.  Provide  reassurance rather than trying to reason or give explanations.  Suggest the word that the person may be looking for.  Help simplify steps of activities in daily life, such as writing down steps of a task. Eliminate tasks that are difficult and frustrating.  Give instructions one step at a time.  Consistency, praise, and encouragement are very important; do not use criticism or punishment.  Agitated or catastrophic reactions are often a reaction to something in the environment or task difficulty/failure; know functional limits; when noticing the person getting upset, help redirect them away from the task and/or move to a different environment.   Test Findings  Test scores are summarized in additional documentation associated with this encounter. Test scores are relative to age, gender, and educational history as available and appropriate. There were no concerns about performance validity despite performance below expectations on one embedded validity measure. She performed within normal limits on 2 other stand-alone measures. I think this likely reflects difficulties with the memory task demands of the embedded measure.   General Intellectual Functioning/Achievement:  Performance on single word reading fell towards the upper end of the average range. Average presents as a reasonable standard of comparison for this patient's cognitive test performance.  Attention and Processing Efficiency: Digit repetition forward and backward were average. Timed number-symbol coding was average. Performance on simple numeric sequencing was low average.  Language: Visual object confrontation naming was within normal limits, falling at an average level. By contrast, she had difficulty on generation of words with unusually low phonemic fluency and extremely low fluency in response to category prompt animals.  Visuospatial Function: Visuospatial/constructional measures fell at an unusually low level on the relevant  index from the RBANS. Copy of a modestly complex figure was unusually low and performance on a putative "pure perceptual" measure involving judgment of angular line orientations was similar.  Learning and Memory: Ending in memory showed profile of scores concerning for advanced storage problems. The patient had difficulties with acquisition of information and retained very little information across time, with minimal to no benefit from recognition cueing. These findings were fairly stable across verbal and visual tasks.  In the visual realm, she learned 3, 4, and 4 words of a 12 item word list across 3 learning trials, which is unusually low. She retained 2 words on short delayed recall (unusually low performance) and 0 words on long delayed recall, which is extremely low. Delayed yes/no recognition discrimination for words from the list versus false choices was 0, with as many false positives as correct identifications. Memory for a short story was unusually low on immediate recall although this time, she did retain just a bit of information with unusually low delayed recall performance. Memory for brief, daily-living type information was low average on immediate recall and extremely low on long delayed recall. Recognition discriminability for that information was extremely low.  Memory for visual information in the form of a modestly complex figure was extremely low (remembered only to figure out line).  Executive Functions: Performance on executive indicators was very good, and represented a domain of significant strength for this patient. She achieved a truly outstanding, very superior range score on the modified [place] card sorting test executive function composition index. Alternating sequencing of numbers and letters of the alphabet was average. She had a harder time on generation of words in response to  the letters F-A-S, which was unusually low. Clock drawing was within normal limits. She did  well when reasoning with complex verbal information on the BDA E.  Rating Scale(s): [name] denied clinically significant levels of depressive symptoms or anxiety symptoms on self rating scales. She was characterized as functioning at a mild dementia level by her son, [name]. I rated a CDR for her, and her sum of boxes score is a 5.5, which is mild dementia.  Viviano Simas Nicole Kindred, PsyD, ABN Clinical Neuropsychologist  Coding and Compliance  Billing below reflects technician time, my direct face-to-face time with the patient, time spent in test administration, and time spent in professional activities including but not limited to: neuropsychological test interpretation, integration of neuropsychological test data with clinical history, report preparation, treatment planning, care coordination, and review of diagnostically pertinent medical history or studies.   Services associated with this encounter: Clinical Interview [phone]) plus 155 minutes (96132/96133; Neuropsychological Evaluation by Professional)  25 minutes (96136/96137; Test Administration by Professional) 95 minutes (96138/96139; Neuropsychological Testing by Technician)

## 2021-06-28 ENCOUNTER — Telehealth: Payer: Self-pay | Admitting: Neurology

## 2021-06-28 NOTE — Telephone Encounter (Signed)
Jillian/Tori: Can one of you call this patient first thing Tuesday morning and remind her that she has an appointment with Dr. Alphonzo Severance on 9/6(looks like 2pm)? She is seeing Jinny Blossom on 9/8 for followup but she still has to see Dr. Nicole Kindred on the 6th prior to seeing megan on the 8th. I don;t want there to be a misunderstanding, Megan's appointment is in addition to the results she will be getting from Dr. Nicole Kindred on the 6th. She has to see Dr. Nicole Kindred prior to seeing Jinny Blossom; Dr. Nicole Kindred will be presenting her with all her results from the formal memory testing on the 6th. She can;t see megan until she has seen dr Nicole Kindred for results.  thanks [employer] fyi thanks)

## 2021-07-02 ENCOUNTER — Ambulatory Visit (INDEPENDENT_AMBULATORY_CARE_PROVIDER_SITE_OTHER): Payer: Medicare Other | Admitting: Counselor

## 2021-07-02 ENCOUNTER — Other Ambulatory Visit: Payer: Self-pay

## 2021-07-02 ENCOUNTER — Encounter: Payer: Self-pay | Admitting: Counselor

## 2021-07-02 DIAGNOSIS — G301 Alzheimer's disease with late onset: Secondary | ICD-10-CM

## 2021-07-02 DIAGNOSIS — F0281 Dementia in other diseases classified elsewhere with behavioral disturbance: Secondary | ICD-10-CM

## 2021-07-02 DIAGNOSIS — I6781 Acute cerebrovascular insufficiency: Secondary | ICD-10-CM

## 2021-07-02 NOTE — Progress Notes (Signed)
   [facility]  Feedback Note: I met with [name] to review the findings resulting from her neuropsychological evaluation. Since the last appointment, she has been about the same. Time was spent reviewing the impressions and recommendations that are detailed in the evaluation report. We discussed impression of mild stage dementia, as reflected in the patient instructions. I took time to explain the findings and answer all the patient's questions. We had a long discussion about behavioral management strategies, and conceptualizing behavior as a form of communication in dementia. I was candid that the past dynamics between the patient and her husband are likely playing into the situation and we discussed alternate ways of interacting. I was also candid that changing roles after more than 60 years of marriage is quite challenging and may not be realistic. I encouraged [name] to contact me should she have any further questions or if further follow up is desired.   Current Medications and Medical History   Current Outpatient Medications  Medication Sig Dispense Refill   acetaminophen (TYLENOL) 325 MG tablet Take 2 tablets (650 mg total) by mouth every 6 (six) hours as needed. 15 tablet 0   bisoprolol (ZEBETA) 10 MG tablet Take 5 mg by mouth 2 (two) times daily.      RESTASIS 0.05 % ophthalmic emulsion Place 1 drop into both eyes as needed.      SYNTHROID 75 MCG tablet Take 75 mcg by mouth daily.     valsartan-hydrochlorothiazide (DIOVAN-HCT) 160-25 MG per tablet Take 1 tablet by mouth daily.       No current facility-administered medications for this visit.    Patient Active Problem List   Diagnosis Date Noted   Late onset Alzheimer's dementia with behavioral disturbance ([place]) 07/02/2021   Memory loss 08/26/2020   Drug reaction 07/13/2020   Thyroid cancer, multifocal papillary thryroid cancer 06/18/2011    Mental Status and Behavioral Observations   [name] presented on time to the present encounter and was alert and generally oriented. Speech was normal in rate, rhythm, volume, and prosody. Self-reported mood was "good" and affect was euthymic. Thought process was logical and goal oriented and thought content was somewhat fixated on perception of husbaned's poor behavior. There were no safety concerns identified at today's encounter, such as thoughts of harming self or others.   Plan  Feedback provided regarding the patient's neuropsychological evaluation. She has mild dementia, which is likely due to Alzheimer's disease, vascular disease, or some combination thereof. [name] was encouraged to contact me if any questions arise or if further follow up is desired.   [name] Michele Kindred, PsyD, ABN Clinical Neuropsychologist  Service(s) Provided at This Encounter: 38 minutes [phone]; Conjoint therapy with patient present)

## 2021-07-02 NOTE — Patient Instructions (Signed)
Your performance and presentation on assessment were consistent with a memory storage problem. That is, you had difficulties acquiring and retaining information across time. This was for both verbal and visual information. You also had some scattered low scores in other areas, but that is the most important finding diagnostically. I think that these findings in the context of your clinical history and the fact that you are having some functional difficulties day to day suggest that dementia is the best diagnosis at this point in time.  Dementia refers to a group of syndromes where multiple areas of ability are damaged in the brain, such as memory, thinking, judgment, and behavior, and most commonly refers to age related causes of dementia that cause worsening in these abilities over time. Alzheimer's disease is the most common form of dementia in people over the age of 5. Not all dementias are due to Alzheimer's disease, but all Alzheimer's disease eventually results in dementia. When dementia is due to an underlying condition affecting the brain such as Alzheimer's disease, there is progression over time, which typically proceeds gradually over many years.    In your case, I have a high level of confidence that your dementia is at least partly due to Alzheimer's disease. This is the most common cause of dementia in individuals your age, it also goes well with your test data, and you also have shrinkage in areas of the brain on your MRI that is often associated with Alzheimer's disease. This is not necessarily the only cause of your decline, however, because you also have an extensive burden of subcortical vascular disease ("wear and tear" on old blood vessels within the brain). This may also be contributing.  Cerebrovascular disease is a major contributor to cognitive decline. Most people are familiar with stroke, which involves acute disruptions in blood flow to the brain that result in damage to underlying  brain tissue. Small vessel ischemic disease is less well known but is more common and can be equally problematic. Small vessel ischemic disease involves wear and tear on small blood vessels in the brain over time and the frequency of this condition increases with age, affecting nearly 100% of people in those older than 90 years. Small vessel ischemic disease increases risk for dementia and imaging findings often attributed to small vessel disease have been associated with Alzheimer's dementia in some studies. The best way to minimize cerebrovascular risk is to actively manage risk factors, including hypertension and high cholesterol. Medications can be helpful but maintaining a healthy body weight, getting regular exercise, and eating a heart-healthy diet such as the MIND diet or DASH diet are also crucially important.    The mainstay of treatment for individuals with dementia is assistance as needed with activities of daily living. This usually includes complex things such as managing finances in the early stages. As the disease progresses, people may need increasing assistance with things such as venturing out into the community to get food or other necessary items, with meal planning, keeping of personal effects and the like. Individuals with dementia typically need assistance with any memory dependent activities such as remembering to take medications, keeping track of doctors appointments, and other social engagements. The more these things can be built into routines that are simple, predictable and easy to follow, the better. Activities with a high risk of adverse outcomes (e.g., driving) need to be carefully monitored, because at some point in the disease progression, it becomes unsafe to drive.    Instead of medications,  I recommend behavioral strategies for dealing with the behavioral and psychological issues that can accompany dementia. Things like agitation, wandering, and anxiety can often be  improved or eliminated using the "three R's." Redirection (help distract your loved one by focusing their attention on something else, moving them to a new environment, or otherwise engaging them in something other than what is distressing to them), Reassurance (reassure them that you are there to take care of them and that there is nothing they need to be worried about), and Reconsidering (consider the situation from their perspective and try to identify if there is something about the situation or environment that may be triggering their reaction).   While medications can sometimes be helpful, oftentimes common sense, patience, and knowledge are the best tools for managing problematic behaviors in dementia. Every individual responds differently, but there are some general recommendations that may be helpful.  Provide assistance as needed with memory-dependent or other cognitively challenging activities (e.g., doctors appointments, medications, planning trips, complex chores, paperwork or other forms).  Use simple, clear words that are easy to understand. Simplify information, be clear and concise.  Provide reassurance rather than trying to reason or give explanations.  Suggest the word that the person may be looking for.  Help simplify steps of activities in daily life, such as writing down steps of a task. Eliminate tasks that are difficult and frustrating.  Give instructions one step at a time.  Consistency, praise, and encouragement are very important; do not use criticism or punishment.  Agitated or catastrophic reactions are often a reaction to something in the environment or task difficulty/failure; know functional limits; when noticing the person getting upset, help redirect them away from the task and/or move to a different environment.

## 2021-07-02 NOTE — Telephone Encounter (Signed)
Called and confirmed both appointments with pt's daughter.

## 2021-07-04 ENCOUNTER — Ambulatory Visit: Payer: Medicare Other | Admitting: Adult Health

## 2021-07-04 ENCOUNTER — Encounter: Payer: Self-pay | Admitting: Adult Health

## 2021-07-04 VITALS — BP 173/80 | HR 70 | Ht 62.0 in | Wt 138.4 lb

## 2021-07-04 DIAGNOSIS — G301 Alzheimer's disease with late onset: Secondary | ICD-10-CM

## 2021-07-04 DIAGNOSIS — F0281 Dementia in other diseases classified elsewhere with behavioral disturbance: Secondary | ICD-10-CM | POA: Diagnosis not present

## 2021-07-04 DIAGNOSIS — F02818 Alzheimer's disease with late onset: Secondary | ICD-10-CM

## 2021-07-04 MED ORDER — DONEPEZIL HCL 5 MG PO TABS: 5.0000 mg | ORAL_TABLET | Freq: Every day | ORAL | 11 refills | Status: DC

## 2021-07-04 NOTE — Patient Instructions (Signed)
Your Plan:  Start Aricept 5 mg at bedtime After 1 month call for dose increase If your symptoms worsen or you develop new symptoms please let us know.   Thank you for coming to see [place] at [facility]. I hope we have been able to provide you high quality care today.  You may receive a patient satisfaction survey over the next few weeks. We would appreciate your feedback and comments so that we may continue to improve ourselves and the health of our patients.

## 2021-07-04 NOTE — Progress Notes (Signed)
PATIENT: [name] DOB: [birth year]  REASON FOR VISIT: follow up HISTORY FROM: patient PRIMARY NEUROLOGIST: Dr. Lavell Anchors  HISTORY OF PRESENT ILLNESS: Today 07/04/21:  [name] is an 87 year old female with a history of memory disturbance.  She returns today for follow-up.  She had neuropsychological evaluation with Dr. Nicole Kindred.  The results of that test indicated a mild dementia perhaps Alzheimer's, vascular or a combination of both.  These results were reviewed with the patient on September 6.  Patient currently lives at home with her husband.  It seems that they do not have a great relationship.  Patient is able to complete all ADLs independently.  She is able to complete household chores but they do have someone that comes in once a month to help.  Daughter reports that she is having some trouble managing her medications.  They are thinking of doing a pillbox or bubble pack.  Denies any significant changes in her mood or behavior.  Returns today for an evaluation.  HISTORY  [name] is a 87 y.o. female here as requested by [facility] Dr. Drema Dallas for memory loss.  I reviewed Dr. Drema Dallas' notes: She has a past medical history of memory problems, postsurgical hypothyroidism, hypertension, hyperlipidemia, osteopenia.  She has high blood pressure but not checking her pressures at home, she has hyperlipidemia her diet is not as good as it could be, she likes bologna and cheese wraps, she usually does take out dinner including meat loaf mashed potato, does not like vegetables, has a history of thyroid cancer and is due for follow-up, patient's daughter accompanied her on the last visit and daughter was concerned that patient may be having some memory issues, patient has said that her husband is taking her things like her ID and her close, patient's husband says he is not but patient denies hallucinations.  CMP, TSH, thyroglobulin antibodies, CBC and B12 were ordered at this  appointment June 19, 2020, TSH 26.63, BUN 12, creatinine 0.98, otherwise CMP unremarkable, CBC normal, thyroglobulin antibody less than 1, vitamin B12 296.     SHe lives with her husband, he thinks she is "going crazy", she has been married 60+ years, patient says she thinks he is crazy. She doesn't think she is losing her memory. She has called him "asshole" but ongoing for 40 years. She went to her primary care and they did a memory test. She does forget things. The kids don;t live there so unsure what is really going on. Son hasn;t seen anything that is severe, she will forget things, she nmay forget some conversations, She denies any memory loss.  She quit driving because of vision. She has a depth perception issue, she recognized she had to stop driving. She says her husband likes other women, this has been ongoing, he takes things from her to give to other women. He came in one day upset, and felt sorry for the girls he works with and for examples with take her coat and give them to other people. He hasn't done it lately because she told him not to. Now she "cusses him out". Son doesn't notice any personality changes. He says no changes in personality.Father dies in 41s, mother was 63, no memory issues.     Was able to speak with son alone: She forgets things, she loses things, she asks the same questions over and over again, she acuses her husband of taking things and she gets them back. Anything that goes missing  she acuses her husband of taking it, even the remote and then she denies she put it there. Son states no personality changes, she has always called her husband jack ass, the problem is the short-term memory loss and the denial that she is losing things. She does not take her medications. She is not eating well. She hasn't been to the doctor in years. Started years ago. She does not prepare meals but this may be a choice, she can still make a baked banana pudding, not cooking is by choice, she  will eat ceral for breakfast and she will eat sandwhich for lunch. Poor diet. Poor insight and judgement.    Reviewed notes, labs and imaging from outside physicians, which showed: see above    REVIEW OF SYSTEMS: Out of a complete 14 system review of symptoms, the patient complains only of the following symptoms, and all other reviewed systems are negative.  ALLERGIES: Allergies  Allergen Reactions   Iodinated Diagnostic Agents Anaphylaxis   Bextra [Valdecoxib] Nausea And Vomiting   Ceclor [Cefaclor] Nausea And Vomiting   Celebrex [Celecoxib] Nausea And Vomiting   Codeine Other (See Comments)    Makes head feel like its going to explode   Demerol     Hallucinate    Doxycycline Nausea And Vomiting   Elemental Sulfur Nausea And Vomiting   Erythromycin Nausea And Vomiting   Meloxicam Nausea And Vomiting   Morphine And Related Nausea And Vomiting and Other (See Comments)    Loopy, knocked out   Penicillins Nausea And Vomiting    Has patient had a PCN reaction causing immediate rash, facial/tongue/throat swelling, SOB or lightheadedness with hypotension: no Has patient had a PCN reaction causing severe rash involving mucus membranes or skin necrosis: no Has patient had a PCN reaction that required hospitalization: no Has patient had a PCN reaction occurring within the last 10 years: no If all of the above answers are "NO", then may proceed with Cephalosporin use.    Scopolamine     Hallucinate    Tramadol Nausea And Vomiting   Vicodin [Hydrocodone-Acetaminophen]     hallucinate   Vioxx [Rofecoxib] Nausea And Vomiting   Quinine Derivatives Rash    HOME MEDICATIONS: Outpatient Medications Prior to Visit  Medication Sig Dispense Refill   acetaminophen (TYLENOL) 325 MG tablet Take 2 tablets (650 mg total) by mouth every 6 (six) hours as needed. 15 tablet 0   bisoprolol (ZEBETA) 10 MG tablet Take 5 mg by mouth 2 (two) times daily.      RESTASIS 0.05 % ophthalmic emulsion Place 1  drop into both eyes as needed.      SYNTHROID 75 MCG tablet Take 75 mcg by mouth daily.     valsartan-hydrochlorothiazide (DIOVAN-HCT) 160-25 MG per tablet Take 1 tablet by mouth daily.       No facility-administered medications prior to visit.    PAST MEDICAL HISTORY: Past Medical History:  Diagnosis Date   Allergy    Bleeding nose    Hyperlipidemia    Hypertension    Kidney stones    Memory problem    Osteopenia    Postsurgical hypothyroidism    Thyroid cancer ([place])    papillary    PAST SURGICAL HISTORY: Past Surgical History:  Procedure Laterality Date   ABDOMINAL HYSTERECTOMY     partial   APPENDECTOMY     BACK SURGERY     rod and screws   BREAST SURGERY     augmentation   CARDIAC CATHETERIZATION  CATARACT EXTRACTION     both   EYE SURGERY     both   LAMINECTOMY     NASAL SEPTUM SURGERY     OOPHORECTOMY     SHOULDER SURGERY     left and right   SINUS IRRIGATION     SUBACROMIAL DECOMPRESSION     THYROIDECTOMY  2012    FAMILY HISTORY: Family History  Problem Relation Age of Onset   Cancer Mother        breast   Stroke Mother    Heart disease Father    Cancer Father        lung   Asthma Father    Allergic rhinitis Father     SOCIAL HISTORY: Social History   Socioeconomic History   Marital status: Married    Spouse name: Not on file   Number of children: 4   Years of education: Not on file   Highest education level: Associate degree: occupational, Hotel manager, or vocational program  Occupational History    Comment: retired  Tobacco Use   Smoking status: Never   Smokeless tobacco: Never  Vaping Use   Vaping Use: Never used  Substance and Sexual Activity   Alcohol use: Yes    Comment: rare, wine   Drug use: Never   Sexual activity: Not on file  Other Topics Concern   Not on file  Social History Narrative   08/23/20 lives with husband   Caffeine- 8 oz day   Social Determinants of Health   Financial Resource Strain: Not on file   Food Insecurity: Not on file  Transportation Needs: Not on file  Physical Activity: Not on file  Stress: Not on file  Social Connections: Not on file  Intimate Partner Violence: Not on file      PHYSICAL EXAM  Vitals:   07/04/21 1333  BP: (!) 173/80  Pulse: 70  Weight: 138 lb 6.4 oz (62.8 kg)  Height: '5\' 2"'$  (1.575 m)   Body mass index is 25.31 kg/m.  Generalized: Well developed, in no acute distress   Neurological examination  Mentation: Alert oriented to time, place, history taking. Follows all commands speech and language fluent Cranial nerve II-XII: Pupils were equal round reactive to light. Extraocular movements were full, visual field were full on confrontational test. Facial sensation and strength were normal. Uvula tongue midline. Head turning and shoulder shrug  were normal and symmetric. Motor: The motor testing reveals 5 over 5 strength of all 4 extremities. Good symmetric motor tone is noted throughout.  Sensory: Sensory testing is intact to soft touch on all 4 extremities. No evidence of extinction is noted.  Coordination: Cerebellar testing reveals good finger-nose-finger and heel-to-shin bilaterally.  Gait and station: Gait is normal.  Reflexes: Deep tendon reflexes are symmetric and normal bilaterally.   DIAGNOSTIC DATA (LABS, IMAGING, TESTING) - I reviewed patient records, labs, notes, testing and imaging myself where available.  Lab Results  Component Value Date   WBC 7.0 12/30/2019   HGB 12.6 12/30/2019   HCT 37.5 12/30/2019   MCV 87.0 12/30/2019   PLT 293 12/30/2019      Component Value Date/Time   NA 140 08/23/2020 1507   K 4.0 08/23/2020 1507   CL 98 08/23/2020 1507   CO2 22 08/23/2020 1507   GLUCOSE 98 08/23/2020 1507   GLUCOSE 89 12/30/2019 0843   BUN 18 08/23/2020 1507   CREATININE 0.99 08/23/2020 1507   CALCIUM 10.0 08/23/2020 1507   PROT 7.0 12/21/2019 1034  ALBUMIN 4.0 12/21/2019 1034   AST 25 12/21/2019 1034   ALT 18 12/21/2019  1034   ALKPHOS 45 12/21/2019 1034   BILITOT 0.7 12/21/2019 1034   GFRNONAA 51 (L) 08/23/2020 1507   GFRAA 59 (L) 08/23/2020 1507   No results found for: CHOL, HDL, LDLCALC, LDLDIRECT, TRIG, CHOLHDL No results found for: HGBA1C Lab Results  Component Value Date   VITAMINB12 504 08/23/2020   No results found for: TSH    ASSESSMENT AND PLAN 87 y.o. year old female  has a past medical history of Allergy, Bleeding nose, Hyperlipidemia, Hypertension, Kidney stones, Memory problem, Osteopenia, Postsurgical hypothyroidism, and Thyroid cancer ([place]). here with:  1.  Mild dementia possibly Alzheimer's, vascular or both  --Discussed the patient's diagnosis at length with the patient and her family --Patient will be started on Aricept 5 mg at bedtime discussed potential side effects with the patient --Advised if tolerates well will increase to 10 mg after 1 month they will call our office --In the future we will start Namenda --Follow-up in 6 months or sooner if needed     Ward Givens, MSN, NP-C 07/04/2021, 1:25 PM [facility] [address], [place], [place] [address] [phone]

## 2021-08-14 ENCOUNTER — Telehealth: Payer: Self-pay | Admitting: Adult Health

## 2021-08-14 NOTE — Telephone Encounter (Signed)
Pt's daughter [name] on [place] called wanting to update provider on how the pt is doing on the donepezil (ARICEPT) 5 MG tablet. She states that the pt has mentioned that when she wakes up in the morning she feels kind of foggy like if she has "Cobwebs in her brain" but then it goes away and sometimes during the day the feeling may come back but it does not interfere with her daily life.

## 2021-08-19 MED ORDER — DONEPEZIL HCL 10 MG PO TABS: 10.0000 mg | ORAL_TABLET | Freq: Every day | ORAL | 4 refills | Status: DC

## 2021-08-19 NOTE — Telephone Encounter (Signed)
Spoke with patient's daughter, Karna Christmas, and discussed message from Isabela NP. Terri said to go ahead and increase the Aricept to 10 mg. She also asked about Namenda and the next OV. I let her know that unless the patient has problems on the 10 mg, after 1 month let us know and we may add Namenda (as noted in previous OV). Confirmed her next appt on 01/02/22. Terri said at first she will give pt two 5 mg tabs. She is aware when she refills the 10 mg tablet to switch to 1 tablet instead of two. She verbalized appreciation and her questions were answered.  Donepezil 10 mg PO QHS ordered and 5 mg discontinued.

## 2022-01-02 ENCOUNTER — Ambulatory Visit: Payer: Medicare Other | Admitting: Adult Health

## 2022-01-02 ENCOUNTER — Encounter: Payer: Self-pay | Admitting: Adult Health

## 2022-01-02 VITALS — BP 156/70 | HR 63 | Ht 62.0 in | Wt 128.0 lb

## 2022-01-02 DIAGNOSIS — F02818 Dementia in other diseases classified elsewhere, unspecified severity, with other behavioral disturbance: Secondary | ICD-10-CM | POA: Diagnosis not present

## 2022-01-02 DIAGNOSIS — G301 Alzheimer's disease with late onset: Secondary | ICD-10-CM | POA: Diagnosis not present

## 2022-01-02 MED ORDER — MEMANTINE HCL 28 X 5 MG & 21 X 10 MG PO TABS: ORAL_TABLET | ORAL | 0 refills | Status: DC

## 2022-01-02 NOTE — Progress Notes (Signed)
PATIENT: [name] DOB: 1931-02-14  REASON FOR VISIT: follow up HISTORY FROM: patient PRIMARY NEUROLOGIST: Dr. Lavell Anchors  HISTORY OF PRESENT ILLNESS: Today 01/02/22: [name] is an 88 year old female with a history of memory disturbance.  She returns today for follow-up with her son.  She feels that her memory has remained stable.  She lives at home with her husband.  [name] her daughter helps with appointments and mediations. Able to complete all ADLS independently. Takes care of her little dog. Husband does the finances. Reports that she is sleeping well. No change in mood or behavior. Does not operate a motor vehicle. Last visit started on Aricept 10 mg at bedtime. Tolerating well.  She returns today for an evaluation  [name] is an 88 year old female with a history of memory disturbance.  She returns today for follow-up.  She had neuropsychological evaluation with Dr. Nicole Kindred.  The results of that test indicated a mild dementia perhaps Alzheimer's, vascular or a combination of both.  These results were reviewed with the patient on September 6.  Patient currently lives at home with her husband.  It seems that they do not have a great relationship.  Patient is able to complete all ADLs independently.  She is able to complete household chores but they do have someone that comes in once a month to help.  Daughter reports that she is having some trouble managing her medications.  They are thinking of doing a pillbox or bubble pack.  Denies any significant changes in her mood or behavior.  Returns today for an evaluation.  HISTORY  [name] is a 88 y.o. female here as requested by [facility] Dr. Drema Dallas for memory loss.  I reviewed Dr. Drema Dallas' notes: She has a past medical history of memory problems, postsurgical hypothyroidism, hypertension, hyperlipidemia, osteopenia.  She has high blood pressure but not checking her pressures at home, she has hyperlipidemia her diet is not  as good as it could be, she likes bologna and cheese wraps, she usually does take out dinner including meat loaf mashed potato, does not like vegetables, has a history of thyroid cancer and is due for follow-up, patient's daughter accompanied her on the last visit and daughter was concerned that patient may be having some memory issues, patient has said that her husband is taking her things like her ID and her close, patient's husband says he is not but patient denies hallucinations.  CMP, TSH, thyroglobulin antibodies, CBC and B12 were ordered at this appointment June 19, 2020, TSH 26.63, BUN 12, creatinine 0.98, otherwise CMP unremarkable, CBC normal, thyroglobulin antibody less than 1, vitamin B12 296.     SHe lives with her husband, he thinks she is "going crazy", she has been married 60+ years, patient says she thinks he is crazy. She doesn't think she is losing her memory. She has called him "asshole" but ongoing for 40 years. She went to her primary care and they did a memory test. She does forget things. The kids don;t live there so unsure what is really going on. Son hasn;t seen anything that is severe, she will forget things, she nmay forget some conversations, She denies any memory loss.  She quit driving because of vision. She has a depth perception issue, she recognized she had to stop driving. She says her husband likes other women, this has been ongoing, he takes things from her to give to other women. He came in one day upset, and felt  sorry for the girls he works with and for examples with take her coat and give them to other people. He hasn't done it lately because she told him not to. Now she "cusses him out". Son doesn't notice any personality changes. He says no changes in personality.Father dies in 53s, mother was 65, no memory issues.     Was able to speak with son alone: She forgets things, she loses things, she asks the same questions over and over again, she acuses her husband of  taking things and she gets them back. Anything that goes missing she acuses her husband of taking it, even the remote and then she denies she put it there. Son states no personality changes, she has always called her husband jack ass, the problem is the short-term memory loss and the denial that she is losing things. She does not take her medications. She is not eating well. She hasn't been to the doctor in years. Started years ago. She does not prepare meals but this may be a choice, she can still make a baked banana pudding, not cooking is by choice, she will eat ceral for breakfast and she will eat sandwhich for lunch. Poor diet. Poor insight and judgement.    Reviewed notes, labs and imaging from outside physicians, which showed: see above    REVIEW OF SYSTEMS: Out of a complete 14 system review of symptoms, the patient complains only of the following symptoms, and all other reviewed systems are negative.  ALLERGIES: Allergies  Allergen Reactions   Iodinated Contrast Media Anaphylaxis   Bextra [Valdecoxib] Nausea And Vomiting   Ceclor [Cefaclor] Nausea And Vomiting   Celebrex [Celecoxib] Nausea And Vomiting   Codeine Other (See Comments)    Makes head feel like its going to explode   Demerol     Hallucinate    Doxycycline Nausea And Vomiting   Elemental Sulfur Nausea And Vomiting   Erythromycin Nausea And Vomiting   Meloxicam Nausea And Vomiting   Morphine And Related Nausea And Vomiting and Other (See Comments)    Loopy, knocked out   Penicillins Nausea And Vomiting    Has patient had a PCN reaction causing immediate rash, facial/tongue/throat swelling, SOB or lightheadedness with hypotension: no Has patient had a PCN reaction causing severe rash involving mucus membranes or skin necrosis: no Has patient had a PCN reaction that required hospitalization: no Has patient had a PCN reaction occurring within the last 10 years: no If all of the above answers are "NO", then may proceed  with Cephalosporin use.    Scopolamine     Hallucinate    Tramadol Nausea And Vomiting   Vicodin [Hydrocodone-Acetaminophen]     hallucinate   Vioxx [Rofecoxib] Nausea And Vomiting   Quinine Derivatives Rash    HOME MEDICATIONS: Outpatient Medications Prior to Visit  Medication Sig Dispense Refill   acetaminophen (TYLENOL) 325 MG tablet Take 2 tablets (650 mg total) by mouth every 6 (six) hours as needed. 15 tablet 0   donepezil (ARICEPT) 10 MG tablet Take 1 tablet (10 mg total) by mouth at bedtime. 30 tablet 4   RESTASIS 0.05 % ophthalmic emulsion Place 1 drop into both eyes as needed.      SYNTHROID 75 MCG tablet Take 75 mcg by mouth daily.     valsartan-hydrochlorothiazide (DIOVAN-HCT) 160-25 MG per tablet Take 1 tablet by mouth daily.       alendronate (FOSAMAX) 10 MG tablet Take 10 mg by mouth daily. (Patient not taking:  Reported on 01/02/2022)     bisoprolol (ZEBETA) 10 MG tablet Take 5 mg by mouth 2 (two) times daily.  (Patient not taking: Reported on 01/02/2022)     No facility-administered medications prior to visit.    PAST MEDICAL HISTORY: Past Medical History:  Diagnosis Date   Allergy    Bleeding nose    Hyperlipidemia    Hypertension    Kidney stones    Memory problem    Osteopenia    Postsurgical hypothyroidism    Thyroid cancer ([place])    papillary    PAST SURGICAL HISTORY: Past Surgical History:  Procedure Laterality Date   ABDOMINAL HYSTERECTOMY     partial   APPENDECTOMY     BACK SURGERY     rod and screws   BREAST SURGERY     augmentation   CARDIAC CATHETERIZATION     CATARACT EXTRACTION     both   EYE SURGERY     both   LAMINECTOMY     NASAL SEPTUM SURGERY     OOPHORECTOMY     SHOULDER SURGERY     left and right   SINUS IRRIGATION     SUBACROMIAL DECOMPRESSION     THYROIDECTOMY  2012    FAMILY HISTORY: Family History  Problem Relation Age of Onset   Cancer Mother        breast   Stroke Mother    Heart disease Father    Cancer  Father        lung   Asthma Father    Allergic rhinitis Father     SOCIAL HISTORY: Social History   Socioeconomic History   Marital status: Married    Spouse name: Not on file   Number of children: 4   Years of education: Not on file   Highest education level: Associate degree: occupational, Hotel manager, or vocational program  Occupational History    Comment: retired  Tobacco Use   Smoking status: Never   Smokeless tobacco: Never  Vaping Use   Vaping Use: Never used  Substance and Sexual Activity   Alcohol use: Not Currently    Comment: rare, wine   Drug use: Never   Sexual activity: Not on file  Other Topics Concern   Not on file  Social History Narrative   08/23/20 lives with husband   Caffeine- 8 oz day   Social Determinants of Health   Financial Resource Strain: Not on file  Food Insecurity: Not on file  Transportation Needs: Not on file  Physical Activity: Not on file  Stress: Not on file  Social Connections: Not on file  Intimate Partner Violence: Not on file      PHYSICAL EXAM  Vitals:   01/02/22 1306  BP: (!) 156/70  Pulse: 63  Weight: 128 lb (58.1 kg)  Height: '5\' 2"'$  (1.575 m)   Body mass index is 23.41 kg/m.  MMSE - Mini Mental State Exam 01/02/2022 08/23/2020  Orientation to time 3 5  Orientation to Place 2 2  Registration 3 3  Attention/ Calculation 5 5  Recall 2 2  Language- name 2 objects 2 2  Language- repeat 1 0  Language- follow 3 step command 3 3  Language- read & follow direction 1 1  Write a sentence 1 1  Copy design 1 1  Total score 24 25     Generalized: Well developed, in no acute distress   Neurological examination  Mentation: Alert oriented to time, place, history taking. Follows all commands  speech and language fluent Cranial nerve II-XII: Pupils were equal round reactive to light. Extraocular movements were full, visual field were full on confrontational test. Facial sensation and strength were normal. Uvula tongue  midline. Head turning and shoulder shrug  were normal and symmetric. Motor: The motor testing reveals 5 over 5 strength of all 4 extremities. Good symmetric motor tone is noted throughout.  Sensory: Sensory testing is intact to soft touch on all 4 extremities. No evidence of extinction is noted.  Coordination: Cerebellar testing reveals good finger-nose-finger and heel-to-shin bilaterally.  Gait and station: Gait is normal.  Reflexes: Deep tendon reflexes are symmetric and normal bilaterally.   DIAGNOSTIC DATA (LABS, IMAGING, TESTING) - I reviewed patient records, labs, notes, testing and imaging myself where available.  Lab Results  Component Value Date   WBC 7.0 12/30/2019   HGB 12.6 12/30/2019   HCT 37.5 12/30/2019   MCV 87.0 12/30/2019   PLT 293 12/30/2019      Component Value Date/Time   NA 140 08/23/2020 1507   K 4.0 08/23/2020 1507   CL 98 08/23/2020 1507   CO2 22 08/23/2020 1507   GLUCOSE 98 08/23/2020 1507   GLUCOSE 89 12/30/2019 0843   BUN 18 08/23/2020 1507   CREATININE 0.99 08/23/2020 1507   CALCIUM 10.0 08/23/2020 1507   PROT 7.0 12/21/2019 1034   ALBUMIN 4.0 12/21/2019 1034   AST 25 12/21/2019 1034   ALT 18 12/21/2019 1034   ALKPHOS 45 12/21/2019 1034   BILITOT 0.7 12/21/2019 1034   GFRNONAA 51 (L) 08/23/2020 1507   GFRAA 59 (L) 08/23/2020 1507   No results found for: CHOL, HDL, LDLCALC, LDLDIRECT, TRIG, CHOLHDL No results found for: HGBA1C Lab Results  Component Value Date   VITAMINB12 504 08/23/2020   No results found for: TSH    ASSESSMENT AND PLAN 88 y.o. year old female  has a past medical history of Allergy, Bleeding nose, Hyperlipidemia, Hypertension, Kidney stones, Memory problem, Osteopenia, Postsurgical hypothyroidism, and Thyroid cancer ([place]). here with:  1.  Mild dementia possibly Alzheimer's, vascular or both  -- Continue Aricept 10 mg at bedtime -- Start Namenda titration pack advised to call with the beginning of week for for  maintenance dose. -- Memory score is stable MMSE 24/30 --Follow-up in 6 months or sooner if needed     Ward Givens, MSN, NP-C 01/02/2022, 1:17 PM [facility] [address] [place], [place] [address] [phone]

## 2022-01-02 NOTE — Patient Instructions (Signed)
Your Plan: ? ?Continue Aricept 10 mg at bedtime ?Start Nameda titration pack. At the beginning of week 4 please call for maintenance dose. ? ?Thank you for coming to see [place] at [facility]. I hope we have been able to provide you high quality care today. ? ?You may receive a patient satisfaction survey over the next few weeks. We would appreciate your feedback and comments so that we may continue to improve ourselves and the health of our patients. ? ?

## 2022-01-10 ENCOUNTER — Other Ambulatory Visit: Payer: Self-pay | Admitting: Adult Health

## 2022-01-27 ENCOUNTER — Other Ambulatory Visit: Payer: Self-pay | Admitting: Adult Health

## 2022-01-27 MED ORDER — MEMANTINE HCL 10 MG PO TABS: 10.0000 mg | ORAL_TABLET | Freq: Two times a day (BID) | ORAL | 3 refills | Status: DC

## 2022-01-27 NOTE — Telephone Encounter (Signed)
I called and LMVM for son of pt, about refill of the titration pack or if needs the memantine '10mg'$  po bid dosing?  Please call back.  ?

## 2022-01-27 NOTE — Telephone Encounter (Signed)
Noted, maintenance Memantine Rx has been sent- 10 mg BID.  ?

## 2022-01-27 NOTE — Telephone Encounter (Signed)
Son, [name] said he is in need of new prescription for 10 mg twice daily.  ?

## 2022-01-27 NOTE — Telephone Encounter (Signed)
Pt's son, Nanna Ertle request refill for memantine [place] TITRATION PAK) tablet pack at [id number] ?

## 2022-07-09 ENCOUNTER — Encounter: Payer: Self-pay | Admitting: Adult Health

## 2022-07-09 ENCOUNTER — Ambulatory Visit: Payer: Medicare Other | Admitting: Adult Health

## 2022-07-09 VITALS — BP 168/65 | HR 59 | Ht 63.0 in | Wt 133.0 lb

## 2022-07-09 DIAGNOSIS — F0280 Dementia in other diseases classified elsewhere without behavioral disturbance: Secondary | ICD-10-CM | POA: Diagnosis not present

## 2022-07-09 DIAGNOSIS — G309 Alzheimer's disease, unspecified: Secondary | ICD-10-CM | POA: Diagnosis not present

## 2022-07-09 NOTE — Progress Notes (Signed)
PATIENT: [name] DOB: [birth year]  REASON FOR VISIT: follow up HISTORY FROM: patient PRIMARY NEUROLOGIST: [name]  Chief Complaint  Patient presents with   Rm 4    Here with [name] her son. Report things are stable. MMSE 21/30, Animals 10.     HISTORY OF PRESENT ILLNESS: Today 07/09/22: [name] is an 88 year old female with a history of memory disturbance.  She returns today for follow-up.  She feels that overall her memory has remained stable.  She is here today with her son.  She is able to complete all ADLs independently.  Her daughter continues to help her with her appointments and medications.  Husband manages the finances.  Denies any change in her mood or behavior.  Reports that she sleeps well.  Good appetite.  Remains on Aricept 10 mg at bedtime and Namenda 10 mg twice a day. Son is interested in her trying the new IV drug leqembi   01/02/22: [name] is an 88 year old female with a history of memory disturbance.  She returns today for follow-up with her son.  She feels that her memory has remained stable.  She lives at home with her husband.  [name] her daughter helps with appointments and mediations. Able to complete all ADLS independently. Takes care of her little dog. Husband does the finances. Reports that she is sleeping well. No change in mood or behavior. Does not operate a motor vehicle. Last visit started on Aricept 10 mg at bedtime. Tolerating well.  She returns today for an evaluation  [name] is an 88 year old female with a history of memory disturbance.  She returns today for follow-up.  She had neuropsychological evaluation with Dr. Nicole Kindred.  The results of that test indicated a mild dementia perhaps Alzheimer's, vascular or a combination of both.  These results were reviewed with the patient on September 6.  Patient currently lives at home with her husband.  It seems that they do not have a great relationship.  Patient is able to complete all ADLs  independently.  She is able to complete household chores but they do have someone that comes in once a month to help.  Daughter reports that she is having some trouble managing her medications.  They are thinking of doing a pillbox or bubble pack.  Denies any significant changes in her mood or behavior.  Returns today for an evaluation.  HISTORY  [name] is a 88 y.o. female here as requested by [facility] [name]* Dr. Drema Dallas for memory loss.  I reviewed Dr. Drema Dallas' notes: She has a past medical history of memory problems, postsurgical hypothyroidism, hypertension, hyperlipidemia, osteopenia.  She has high blood pressure but not checking her pressures at home, she has hyperlipidemia her diet is not as good as it could be, she likes bologna and cheese wraps, she usually does take out dinner including meat loaf mashed potato, does not like vegetables, has a history of thyroid cancer and is due for follow-up, patient's daughter accompanied her on the last visit and daughter was concerned that patient may be having some memory issues, patient has said that her husband is taking her things like her ID and her close, patient's husband says he is not but patient denies hallucinations.  CMP, TSH, thyroglobulin antibodies, CBC and B12 were ordered at this appointment June 19, 2020, TSH 26.63, BUN 12, creatinine 0.98, otherwise CMP unremarkable, CBC normal, thyroglobulin antibody less than 1, vitamin B12 296.     SHe  lives with her husband, he thinks she is "going crazy", she has been married 60+ years, patient says she thinks he is crazy. She doesn't think she is losing her memory. She has called him "asshole" but ongoing for 40 years. She went to her primary care and they did a memory test. She does forget things. The kids don;t live there so unsure what is really going on. Son hasn;t seen anything that is severe, she will forget things, she nmay forget some conversations, She denies any memory loss.  She  quit driving because of vision. She has a depth perception issue, she recognized she had to stop driving. She says her husband likes other women, this has been ongoing, he takes things from her to give to other women. He came in one day upset, and felt sorry for the girls he works with and for examples with take her coat and give them to other people. He hasn't done it lately because she told him not to. Now she "cusses him out". Son doesn't notice any personality changes. He says no changes in personality.Father dies in 45s, mother was 61, no memory issues.     Was able to speak with son alone: She forgets things, she loses things, she asks the same questions over and over again, she acuses her husband of taking things and she gets them back. Anything that goes missing she acuses her husband of taking it, even the remote and then she denies she put it there. Son states no personality changes, she has always called her husband jack ass, the problem is the short-term memory loss and the denial that she is losing things. She does not take her medications. She is not eating well. She hasn't been to the doctor in years. Started years ago. She does not prepare meals but this may be a choice, she can still make a baked banana pudding, not cooking is by choice, she will eat ceral for breakfast and she will eat sandwhich for lunch. Poor diet. Poor insight and judgement.    Reviewed notes, labs and imaging from outside physicians, which showed: see above    REVIEW OF SYSTEMS: Out of a complete 14 system review of symptoms, the patient complains only of the following symptoms, and all other reviewed systems are negative.  ALLERGIES: Allergies  Allergen Reactions   Iodinated Contrast Media Anaphylaxis   Bextra [Valdecoxib] Nausea And Vomiting   Ceclor [Cefaclor] Nausea And Vomiting   Celebrex [Celecoxib] Nausea And Vomiting   Codeine Other (See Comments)    Makes head feel like its going to explode   Demerol      Hallucinate    Doxycycline Nausea And Vomiting   Elemental Sulfur Nausea And Vomiting   Erythromycin Nausea And Vomiting   Meloxicam Nausea And Vomiting   Morphine And Related Nausea And Vomiting and Other (See Comments)    Loopy, knocked out   Penicillins Nausea And Vomiting    Has patient had a PCN reaction causing immediate rash, facial/tongue/throat swelling, SOB or lightheadedness with hypotension: no Has patient had a PCN reaction causing severe rash involving mucus membranes or skin necrosis: no Has patient had a PCN reaction that required hospitalization: no Has patient had a PCN reaction occurring within the last 10 years: no If all of the above answers are "NO", then may proceed with Cephalosporin use.    Scopolamine     Hallucinate    Tramadol Nausea And Vomiting   Vicodin [Hydrocodone-Acetaminophen]  hallucinate   Vioxx [Rofecoxib] Nausea And Vomiting   Quinine Derivatives Rash    HOME MEDICATIONS: Outpatient Medications Prior to Visit  Medication Sig Dispense Refill   acetaminophen (TYLENOL) 325 MG tablet Take 2 tablets (650 mg total) by mouth every 6 (six) hours as needed. 15 tablet 0   alendronate (FOSAMAX) 10 MG tablet Take 10 mg by mouth daily.     bisoprolol (ZEBETA) 10 MG tablet Take 5 mg by mouth 2 (two) times daily.     donepezil (ARICEPT) 10 MG tablet TAKE 1 TABLET BY MOUTH EVERYDAY AT BEDTIME 90 tablet 5   memantine (NAMENDA) 10 MG tablet Take 1 tablet (10 mg total) by mouth 2 (two) times daily. 180 tablet 3   RESTASIS 0.05 % ophthalmic emulsion Place 1 drop into both eyes as needed.      SYNTHROID 75 MCG tablet Take 75 mcg by mouth daily.     valsartan-hydrochlorothiazide (DIOVAN-HCT) 160-25 MG per tablet Take 1 tablet by mouth daily.       No facility-administered medications prior to visit.    PAST MEDICAL HISTORY: Past Medical History:  Diagnosis Date   Allergy    Bleeding nose    Hyperlipidemia    Hypertension    Kidney stones     Memory problem    Osteopenia    Postsurgical hypothyroidism    Thyroid cancer ([place])    papillary    PAST SURGICAL HISTORY: Past Surgical History:  Procedure Laterality Date   ABDOMINAL HYSTERECTOMY     partial   APPENDECTOMY     BACK SURGERY     rod and screws   BREAST SURGERY     augmentation   CARDIAC CATHETERIZATION     CATARACT EXTRACTION     both   EYE SURGERY     both   LAMINECTOMY     NASAL SEPTUM SURGERY     OOPHORECTOMY     SHOULDER SURGERY     left and right   SINUS IRRIGATION     SUBACROMIAL DECOMPRESSION     THYROIDECTOMY  2012    FAMILY HISTORY: Family History  Problem Relation Age of Onset   Cancer Mother        breast   Stroke Mother    Heart disease Father    Cancer Father        lung   Asthma Father    Allergic rhinitis Father     SOCIAL HISTORY: Social History   Socioeconomic History   Marital status: Married    Spouse name: Not on file   Number of children: 4   Years of education: Not on file   Highest education level: Associate degree: occupational, Hotel manager, or vocational program  Occupational History    Comment: retired  Tobacco Use   Smoking status: Never   Smokeless tobacco: Never  Vaping Use   Vaping Use: Never used  Substance and Sexual Activity   Alcohol use: Not Currently    Comment: rare, wine   Drug use: Never   Sexual activity: Not on file  Other Topics Concern   Not on file  Social History Narrative   08/23/20 lives with husband   Caffeine- 2 cups/day   Social Determinants of Health   Financial Resource Strain: Not on file  Food Insecurity: Not on file  Transportation Needs: Not on file  Physical Activity: Not on file  Stress: Not on file  Social Connections: Not on file  Intimate Partner Violence: Not on file  PHYSICAL EXAM  Vitals:   07/09/22 1408  BP: (!) 168/65  Pulse: (!) 59  Weight: 133 lb (60.3 kg)  Height: '5\' 3"'$  (1.6 m)   Body mass index is 23.56 kg/m.     07/09/2022    2:13  PM 01/02/2022    1:09 PM 08/23/2020    2:17 PM  MMSE - Mini Mental State Exam  Orientation to time '3 3 5  '$ Orientation to Place '2 2 2  '$ Registration '3 3 3  '$ Attention/ Calculation '3 5 5  '$ Recall '1 2 2  '$ Language- name 2 objects '2 2 2  '$ Language- repeat 1 1 0  Language- follow 3 step command '3 3 3  '$ Language- read & follow direction '1 1 1  '$ Write a sentence '1 1 1  '$ Copy design '1 1 1  '$ Total score '21 24 25     '$ Generalized: Well developed, in no acute distress   Neurological examination  Mentation: Alert oriented to time, place, history taking. Follows all commands speech and language fluent Cranial nerve II-XII: Pupils were equal round reactive to light. Extraocular movements were full, visual field were full on confrontational test. Facial sensation and strength were normal. Uvula tongue midline. Head turning and shoulder shrug  were normal and symmetric. Motor: The motor testing reveals 5 over 5 strength of all 4 extremities. Good symmetric motor tone is noted throughout.  Sensory: Sensory testing is intact to soft touch on all 4 extremities. No evidence of extinction is noted.  Coordination: Cerebellar testing reveals good finger-nose-finger and heel-to-shin bilaterally.  Gait and station: Gait is normal.    DIAGNOSTIC DATA (LABS, IMAGING, TESTING) - I reviewed patient records, labs, notes, testing and imaging myself where available.  Lab Results  Component Value Date   WBC 7.0 12/30/2019   HGB 12.6 12/30/2019   HCT 37.5 12/30/2019   MCV 87.0 12/30/2019   PLT 293 12/30/2019      Component Value Date/Time   NA 140 08/23/2020 1507   K 4.0 08/23/2020 1507   CL 98 08/23/2020 1507   CO2 22 08/23/2020 1507   GLUCOSE 98 08/23/2020 1507   GLUCOSE 89 12/30/2019 0843   BUN 18 08/23/2020 1507   CREATININE 0.99 08/23/2020 1507   CALCIUM 10.0 08/23/2020 1507   PROT 7.0 12/21/2019 1034   ALBUMIN 4.0 12/21/2019 1034   AST 25 12/21/2019 1034   ALT 18 12/21/2019 1034   ALKPHOS 45  12/21/2019 1034   BILITOT 0.7 12/21/2019 1034   GFRNONAA 51 (L) 08/23/2020 1507   GFRAA 59 (L) 08/23/2020 1507     ASSESSMENT AND PLAN 88 y.o. year old female  has a past medical history of Allergy, Bleeding nose, Hyperlipidemia, Hypertension, Kidney stones, Memory problem, Osteopenia, Postsurgical hypothyroidism, and Thyroid cancer ([place]). here with:  1.  Mild dementia possibly Alzheimer's, vascular or both  -- Continue Aricept 10 mg at bedtime --Continue Namenda 10 mg twice a day -- Memory score is slightly decreased MMSE 21/30 --Follow-up in 6 months or sooner if needed     Ward Givens, MSN, NP-C 07/09/2022, 2:24 PM [facility] [address] [place], [place] [address] [phone]

## 2022-07-20 ENCOUNTER — Emergency Department: Payer: Medicare Other

## 2022-07-20 ENCOUNTER — Emergency Department
Admission: EM | Admit: 2022-07-20 | Discharge: 2022-07-20 | Disposition: A | Discharge status: Home / Self Care | Payer: Medicare Other | Attending: Emergency Medicine | Admitting: Emergency Medicine

## 2022-07-20 ENCOUNTER — Other Ambulatory Visit: Payer: Self-pay

## 2022-07-20 DIAGNOSIS — R519 Headache, unspecified: Secondary | ICD-10-CM | POA: Diagnosis not present

## 2022-07-20 DIAGNOSIS — R0789 Other chest pain: Secondary | ICD-10-CM | POA: Diagnosis present

## 2022-07-20 DIAGNOSIS — W010XXA Fall on same level from slipping, tripping and stumbling without subsequent striking against object, initial encounter: Secondary | ICD-10-CM | POA: Insufficient documentation

## 2022-07-20 DIAGNOSIS — W19XXXA Unspecified fall, initial encounter: Secondary | ICD-10-CM

## 2022-07-20 LAB — CBC
HCT: 38.2 % (ref 36.0–46.0)
Hemoglobin: 12.6 g/dL (ref 12.0–15.0)
MCH: 28.8 pg (ref 26.0–34.0)
MCHC: 33 g/dL (ref 30.0–36.0)
MCV: 87.2 fL (ref 80.0–100.0)
Platelets: 338 10*3/uL (ref 150–400)
RBC: 4.38 MIL/uL (ref 3.87–5.11)
RDW: 13 % (ref 11.5–15.5)
WBC: 8 10*3/uL (ref 4.0–10.5)
nRBC: 0 % (ref 0.0–0.2)

## 2022-07-20 LAB — BASIC METABOLIC PANEL
Anion gap: 7 (ref 5–15)
BUN: 25 mg/dL — ABNORMAL HIGH (ref 8–23)
CO2: 27 mmol/L (ref 22–32)
Calcium: 9.1 mg/dL (ref 8.9–10.3)
Chloride: 101 mmol/L (ref 98–111)
Creatinine, Ser: 1.38 mg/dL — ABNORMAL HIGH (ref 0.44–1.00)
GFR, Estimated: 37 mL/min — ABNORMAL LOW (ref >60)
Glucose, Bld: 131 mg/dL — ABNORMAL HIGH (ref 70–99)
Potassium: 3.8 mmol/L (ref 3.5–5.1)
Sodium: 135 mmol/L (ref 135–145)

## 2022-07-20 LAB — TROPONIN I (HIGH SENSITIVITY): Troponin I (High Sensitivity): 9 ng/L (ref <18)

## 2022-07-20 NOTE — Discharge Instructions (Signed)
Please seek medical attention for any high fevers, chest pain, shortness of breath, change in behavior, persistent vomiting, bloody stool or any other new or concerning symptoms.  

## 2022-07-20 NOTE — ED Provider Notes (Signed)
[facility] Provider Note    Event Date/Time   First MD Initiated Contact with Patient 07/20/22 2044     (approximate)   History   Fall and Chest Pain   HPI  [name] is a 88 y.o. female  who presents to the emergency department today because of concern for chest pain after a fall. Some history is obtained from family at bedside. The patient was going to answer the door for her niece when she got tripped up by her dog. Fell forward. Started complaining of central lower chest pain that then wrapped around her right lower chest. The patient denies any other pain.    Physical Exam   Triage Vital Signs: ED Triage Vitals [07/20/22 1924]  Enc Vitals Group     BP (!) 177/74     Pulse Rate 68     Resp 18     Temp 98.2 F (36.8 C)     Temp Source Oral     SpO2 100 %     Weight 132 lb 4.4 oz (60 kg)     Height '5\' 3"'$  (1.6 m)     Head Circumference      Peak Flow      Pain Score      Pain Loc      Pain Edu?      Excl. in [place]?     Most recent vital signs: Vitals:   07/20/22 1924  BP: (!) 177/74  Pulse: 68  Resp: 18  Temp: 98.2 F (36.8 C)  SpO2: 100%   General: Awake, alert, oriented. CV:  Good peripheral perfusion. Regular rate and rhythm. Resp:  Normal effort. Lungs clear. Abd:  No distention. Non tender.     ED Results / Procedures / Treatments   Labs (all labs ordered are listed, but only abnormal results are displayed) Labs Reviewed  BASIC METABOLIC PANEL - Abnormal; Notable for the following components:      Result Value   Glucose, Bld 131 (*)    BUN 25 (*)    Creatinine, Ser 1.38 (*)    GFR, Estimated 37 (*)    All other components within normal limits  CBC  TROPONIN I (HIGH SENSITIVITY)     EKG  I, Nance Pear, attending physician, personally viewed and interpreted this EKG  EKG Time: 1928 Rate: 65 Rhythm: sinus rhythm with 1st degree av block Axis: left axis deviation Intervals: qtc 465 QRS: LBBB ST  changes: no st elevation Impression: abnormal ekg   RADIOLOGY I independently interpreted and visualized the CT head/cervical spine. My interpretation: No intracranial bleed. No acute osseous abnormality Radiology interpretation:  IMPRESSION:  1. No acute intracranial abnormality. Generalized atrophy and small  vessel white matter disease.  2. No acute fracture in the cervical spine. Multilevel degenerative  spondylosis.    I independently interpreted and visualized the CXR. My interpretation: No pneumonia.  Radiology interpretation:  IMPRESSION:  1. No active cardiopulmonary disease.  2.  Aortic Atherosclerosis (ICD10-I70.0).      PROCEDURES:  Critical Care performed: No  Procedures   MEDICATIONS ORDERED IN ED: Medications - No data to display   IMPRESSION / MDM / [place] / ED COURSE  I reviewed the triage vital signs and the nursing notes.                              Differential diagnosis includes, but is not  limited to, fracture, dislocation, contusion.  Patient's presentation is most consistent with acute presentation with potential threat to life or bodily function.  Patient presented to the emergency department today because of concerns for chest pain after a fall.  Both x-ray as well as CT scan without any acute fracture or pneumothorax.  EKG and blood work without findings concerning for ACS.  At this time I think likely patient suffering from some contusions.  We will plan on discharging home.  FINAL CLINICAL IMPRESSION(S) / ED DIAGNOSES   Final diagnoses:  Fall, initial encounter  Chest wall pain     Note:  This document was prepared using Dragon voice recognition software and may include unintentional dictation errors.    Nance Pear, MD 07/20/22 2219

## 2022-07-20 NOTE — ED Triage Notes (Signed)
Pt's daughter states she fell today after getting tripped over her dog. Per daughter pt states she did not hit her head, but has been complaining of chest pain post fall.

## 2022-10-27 DIAGNOSIS — S022XXA Fracture of nasal bones, initial encounter for closed fracture: Secondary | ICD-10-CM

## 2022-10-27 HISTORY — DX: Fracture of nasal bones, initial encounter for closed fracture: S02.2XXA

## 2022-11-18 ENCOUNTER — Other Ambulatory Visit: Payer: Self-pay | Admitting: Adult Health

## 2022-11-20 MED ORDER — MEMANTINE HCL 10 MG PO TABS: 10.0000 mg | ORAL_TABLET | Freq: Two times a day (BID) | ORAL | 3 refills | Status: DC

## 2023-01-31 ENCOUNTER — Other Ambulatory Visit: Payer: Self-pay | Admitting: Adult Health

## 2023-02-05 NOTE — Telephone Encounter (Signed)
Pt's daughter called wanting to know when this will be called in for the pt.

## 2023-06-12 ENCOUNTER — Emergency Department: Payer: Medicare Other

## 2023-06-12 ENCOUNTER — Other Ambulatory Visit: Payer: Self-pay

## 2023-06-12 ENCOUNTER — Emergency Department
Admission: EM | Admit: 2023-06-12 | Discharge: 2023-06-12 | Disposition: A | Discharge status: Home / Self Care | Payer: Medicare Other | Attending: Emergency Medicine | Admitting: Emergency Medicine

## 2023-06-12 DIAGNOSIS — S0990XA Unspecified injury of head, initial encounter: Secondary | ICD-10-CM | POA: Diagnosis not present

## 2023-06-12 DIAGNOSIS — S022XXA Fracture of nasal bones, initial encounter for closed fracture: Secondary | ICD-10-CM | POA: Diagnosis not present

## 2023-06-12 DIAGNOSIS — S01511A Laceration without foreign body of lip, initial encounter: Secondary | ICD-10-CM | POA: Diagnosis not present

## 2023-06-12 DIAGNOSIS — S0992XA Unspecified injury of nose, initial encounter: Secondary | ICD-10-CM | POA: Diagnosis present

## 2023-06-12 DIAGNOSIS — W541XXA Struck by dog, initial encounter: Secondary | ICD-10-CM | POA: Insufficient documentation

## 2023-06-12 DIAGNOSIS — W19XXXA Unspecified fall, initial encounter: Secondary | ICD-10-CM

## 2023-06-12 MED ORDER — CEPHALEXIN 500 MG PO CAPS: 1000.0000 mg | ORAL_CAPSULE | Freq: Two times a day (BID) | ORAL | 0 refills | Status: DC

## 2023-06-12 NOTE — ED Provider Notes (Signed)
[facility] Provider Note  Patient Contact: 9:16 PM (approximate)   History   Fall   HPI  [name] is a 89 y.o. female who presents the emergency department after mechanical fall.  Patient states that her dog ran in front of her causing her to trip and fall and hit her face.  No loss of consciousness.  Patient is primarily concerned for to the outer upper lip.  She does have ecchymosis to the right cheek region as well.  Patient is at her baseline according to family this with her at this time.  Patient denies any other musculoskeletal injury or pain.     Physical Exam   Triage Vital Signs: ED Triage Vitals  Encounter Vitals Group     BP 06/12/23 1646 (!) 178/87     Systolic BP Percentile --      Diastolic BP Percentile --      Pulse Rate 06/12/23 1646 75     Resp 06/12/23 1646 16     Temp 06/12/23 1646 97.8 F (36.6 C)     Temp Source 06/12/23 1646 Oral     SpO2 06/12/23 1646 95 %     Weight 06/12/23 1644 132 lb 4.4 oz (60 kg)     Height 06/12/23 1644 5\' 3"  (1.6 m)     Head Circumference --      Peak Flow --      Pain Score 06/12/23 1643 0     Pain Loc --      Pain Education --      Exclude from Growth Chart --     Most recent vital signs: Vitals:   06/12/23 2027 06/12/23 2030  BP: (!) 195/60 (!) 184/64  Pulse: 62   Resp: 16   Temp:    SpO2: 96%      General: Alert and in no acute distress. Head: Visualization of the head and face reveals a laceration above the left is relatively superficial and measures 1 cm in length.  Ecchymosis to the nasal bridge and right cheek region.  No other open wounds.  No other visible signs of trauma.  Tender over the nasal bridge, no palpable abnormality.  No battle signs, raccoon eyes or serosanguineous fluid drainage from the ears or nares. ENT:      Ears:       Nose: No congestion/rhinnorhea.  No epistaxis      Mouth/Throat: Mucous membranes are moist.  Small laceration to the upper lip on the  anterior surface.  No loose dentition Neck: No stridor. No cervical spine tenderness to palpation  Cardiovascular:  Good peripheral perfusion Respiratory: Normal respiratory effort without tachypnea or retractions. Lungs CTAB. Good air entry to the bases with no decreased or absent breath sounds. Musculoskeletal: Full range of motion to all extremities.  Neurologic:  No gross focal neurologic deficits are appreciated.  Skin:   No rash noted Other:   ED Results / Procedures / Treatments   Labs (all labs ordered are listed, but only abnormal results are displayed) Labs Reviewed - No data to display   EKG     RADIOLOGY  I personally viewed, evaluated, and interpreted these images as part of my medical decision making, as well as reviewing the written report by the radiologist.  ED Provider Interpretation: Nasal bone fracture with no other acute abnormality on CT head, face and cervical spine.  CT Cervical Spine Wo Contrast  Result Date: 06/12/2023 CLINICAL DATA:  Neck trauma (Age >=  65y); Head trauma, minor (Age >= 65y); Facial trauma, blunt. EXAM: CT HEAD WITHOUT CONTRAST CT MAXILLOFACIAL WITHOUT CONTRAST CT CERVICAL SPINE WITHOUT CONTRAST TECHNIQUE: Multidetector CT imaging of the head, cervical spine, and maxillofacial structures were performed using the standard protocol without intravenous contrast. Multiplanar CT image reconstructions of the cervical spine and maxillofacial structures were also generated. RADIATION DOSE REDUCTION: This exam was performed according to the departmental dose-optimization program which includes automated exposure control, adjustment of the mA and/or kV according to patient size and/or use of iterative reconstruction technique. COMPARISON:  CT head and cervical spine 07/20/2022. FINDINGS: CT HEAD FINDINGS Brain: No acute hemorrhage. Unchanged severe chronic small-vessel disease. Cortical gray-white differentiation is otherwise preserved. Prominence of the  ventricles and sulci within expected range for age. No hydrocephalus or extra-axial collection. No mass effect or midline shift. Vascular: No hyperdense vessel or unexpected calcification. Skull: No calvarial fracture or suspicious bone lesion. Skull base is unremarkable. Other: None. CT MAXILLOFACIAL FINDINGS Osseous: Acute, mildly displaced fractures of the bilateral nasal bones. No mandibular dislocation. Orbits: Negative. No traumatic or inflammatory finding. Sinuses: Well aerated. Soft tissues: Mild soft tissue swelling along the nose. CT CERVICAL SPINE FINDINGS Alignment: Normal. Skull base and vertebrae: No acute fracture. Normal craniocervical junction. No suspicious bone lesions. Soft tissues and spinal canal: No prevertebral fluid or swelling. No visible canal hematoma. Disc levels: Multilevel cervical spondylosis without high-grade spinal canal stenosis. Upper chest: No acute findings. Other: None. IMPRESSION: 1. No acute intracranial abnormality. Unchanged severe chronic small-vessel disease. 2. Acute, mildly displaced fractures of the bilateral nasal bones. 3. No acute cervical spine fracture or traumatic listhesis. Electronically Signed   By: Orvan Falconer M.D.   On: 06/12/2023 17:25   CT Head Wo Contrast  Result Date: 06/12/2023 CLINICAL DATA:  Neck trauma (Age >= 65y); Head trauma, minor (Age >= 65y); Facial trauma, blunt. EXAM: CT HEAD WITHOUT CONTRAST CT MAXILLOFACIAL WITHOUT CONTRAST CT CERVICAL SPINE WITHOUT CONTRAST TECHNIQUE: Multidetector CT imaging of the head, cervical spine, and maxillofacial structures were performed using the standard protocol without intravenous contrast. Multiplanar CT image reconstructions of the cervical spine and maxillofacial structures were also generated. RADIATION DOSE REDUCTION: This exam was performed according to the departmental dose-optimization program which includes automated exposure control, adjustment of the mA and/or kV according to patient size  and/or use of iterative reconstruction technique. COMPARISON:  CT head and cervical spine 07/20/2022. FINDINGS: CT HEAD FINDINGS Brain: No acute hemorrhage. Unchanged severe chronic small-vessel disease. Cortical gray-white differentiation is otherwise preserved. Prominence of the ventricles and sulci within expected range for age. No hydrocephalus or extra-axial collection. No mass effect or midline shift. Vascular: No hyperdense vessel or unexpected calcification. Skull: No calvarial fracture or suspicious bone lesion. Skull base is unremarkable. Other: None. CT MAXILLOFACIAL FINDINGS Osseous: Acute, mildly displaced fractures of the bilateral nasal bones. No mandibular dislocation. Orbits: Negative. No traumatic or inflammatory finding. Sinuses: Well aerated. Soft tissues: Mild soft tissue swelling along the nose. CT CERVICAL SPINE FINDINGS Alignment: Normal. Skull base and vertebrae: No acute fracture. Normal craniocervical junction. No suspicious bone lesions. Soft tissues and spinal canal: No prevertebral fluid or swelling. No visible canal hematoma. Disc levels: Multilevel cervical spondylosis without high-grade spinal canal stenosis. Upper chest: No acute findings. Other: None. IMPRESSION: 1. No acute intracranial abnormality. Unchanged severe chronic small-vessel disease. 2. Acute, mildly displaced fractures of the bilateral nasal bones. 3. No acute cervical spine fracture or traumatic listhesis. Electronically Signed   By: Elwyn Reach.D.  On: 06/12/2023 17:25   CT MAXILLOFACIAL WO CONTRAST  Result Date: 06/12/2023 CLINICAL DATA:  Neck trauma (Age >= 65y); Head trauma, minor (Age >= 65y); Facial trauma, blunt. EXAM: CT HEAD WITHOUT CONTRAST CT MAXILLOFACIAL WITHOUT CONTRAST CT CERVICAL SPINE WITHOUT CONTRAST TECHNIQUE: Multidetector CT imaging of the head, cervical spine, and maxillofacial structures were performed using the standard protocol without intravenous contrast. Multiplanar CT image  reconstructions of the cervical spine and maxillofacial structures were also generated. RADIATION DOSE REDUCTION: This exam was performed according to the departmental dose-optimization program which includes automated exposure control, adjustment of the mA and/or kV according to patient size and/or use of iterative reconstruction technique. COMPARISON:  CT head and cervical spine 07/20/2022. FINDINGS: CT HEAD FINDINGS Brain: No acute hemorrhage. Unchanged severe chronic small-vessel disease. Cortical gray-white differentiation is otherwise preserved. Prominence of the ventricles and sulci within expected range for age. No hydrocephalus or extra-axial collection. No mass effect or midline shift. Vascular: No hyperdense vessel or unexpected calcification. Skull: No calvarial fracture or suspicious bone lesion. Skull base is unremarkable. Other: None. CT MAXILLOFACIAL FINDINGS Osseous: Acute, mildly displaced fractures of the bilateral nasal bones. No mandibular dislocation. Orbits: Negative. No traumatic or inflammatory finding. Sinuses: Well aerated. Soft tissues: Mild soft tissue swelling along the nose. CT CERVICAL SPINE FINDINGS Alignment: Normal. Skull base and vertebrae: No acute fracture. Normal craniocervical junction. No suspicious bone lesions. Soft tissues and spinal canal: No prevertebral fluid or swelling. No visible canal hematoma. Disc levels: Multilevel cervical spondylosis without high-grade spinal canal stenosis. Upper chest: No acute findings. Other: None. IMPRESSION: 1. No acute intracranial abnormality. Unchanged severe chronic small-vessel disease. 2. Acute, mildly displaced fractures of the bilateral nasal bones. 3. No acute cervical spine fracture or traumatic listhesis. Electronically Signed   By: Orvan Falconer M.D.   On: 06/12/2023 17:25    PROCEDURES:  Critical Care performed: No  ..Laceration Repair  Date/Time: 06/12/2023 9:24 PM  Performed by: Racheal Patches,  PA-C Authorized by: Racheal Patches, PA-C   Consent:    Consent obtained:  Verbal   Consent given by:  Patient   Risks discussed:  Infection, pain and poor cosmetic result Universal protocol:    Procedure explained and questions answered to patient or proxy's satisfaction: yes     Immediately prior to procedure, a time out was called: yes     Patient identity confirmed:  Verbally with patient and arm band Anesthesia:    Anesthesia method:  None Laceration details:    Location:  Face   Facial location: upper lip.   Length (cm):  1.5 Exploration:    Imaging obtained comment:  CT   Wound extent: no underlying fracture     Contaminated: no   Treatment:    Area cleansed with:  Shur-Clens   Amount of cleaning:  Standard Skin repair:    Repair method:  Tissue adhesive Approximation:    Approximation:  Close Repair type:    Repair type:  Simple Post-procedure details:    Dressing:  Open (no dressing)   Procedure completion:  Tolerated well, no immediate complications    MEDICATIONS ORDERED IN ED: Medications - No data to display   IMPRESSION / MDM / ASSESSMENT AND PLAN / ED COURSE  I reviewed the triage vital signs and the nursing notes.                                 Differential  diagnosis includes, but is not limited to, facial fracture, nasal fracture, skull fracture, intracranial hemorrhage, concussion, broken dentition, lip laceration of the   Patient's presentation is most consistent with acute presentation with potential threat to life or bodily function.   Patient's diagnosis is consistent with fall, lip laceration, nasal fracture.  Patient presents the emergency department after mechanical fall.  She tripped over her dog and fell hitting her face on the ground.  No loss of consciousness.  Neuroexam was reassuring.  Patient had ecchymosis and bruising about the nose and to the right cheek and a small laceration above the lip.  Laceration was closed with  Dermabond as described above.  Patient does have a nasal fracture that does not require immediate treatment.  Concerning signs and symptoms of return precautions discussed with the patient and her family member.  At this time patient is stable for discharge.  Follow-up primary care as needed. Patient is given ED precautions to return to the ED for any worsening or new symptoms.     FINAL CLINICAL IMPRESSION(S) / ED DIAGNOSES   Final diagnoses:  Fall, initial encounter  Closed fracture of nasal bone, initial encounter  Lip laceration, initial encounter     Rx / DC Orders   ED Discharge Orders          Ordered    cephALEXin (KEFLEX) 500 MG capsule  2 times daily        06/12/23 2130             Note:  This document was prepared using Dragon voice recognition software and may include unintentional dictation errors.   Lanette Hampshire 06/12/23 2131    Chesley Noon, MD 06/13/23 2337

## 2023-06-12 NOTE — ED Triage Notes (Signed)
Sitting down and got up to go to the bathroom, dog jumped down and tripped patient.  Patient fell forward.  Laceration to upper lip, bruising to nose and right cheek.  Denies LOC

## 2023-06-12 NOTE — ED Notes (Signed)
Patient has bruising and swelling around the nose. In addition patient has a laceration above the lip and inside the upper lip as well.

## 2023-07-05 NOTE — Progress Notes (Unsigned)
[name]: [name] DOB: 24-Aug-1931  REASON FOR VISIT: follow up HISTORY FROM: [name] PRIMARY NEUROLOGIST: Dr. Lucia Gaskins  No chief complaint on file.    HISTORY OF PRESENT ILLNESS: Today 07/05/23:  [name] is a 89 y.o. female with a history of Memory disturbance. Returns today for follow-up. Remains on Aricept 10 mg      07/09/22: [name] is an 89 year old female with a history of memory disturbance.  She returns today for follow-up.  She feels that overall her memory has remained stable.  She is here today with her son.  She is able to complete all ADLs independently.  Her daughter continues to help her with her appointments and medications.  Husband manages the finances.  Denies any change in her mood or behavior.  Reports that she sleeps well.  Good appetite.  Remains on Aricept 10 mg at bedtime and Namenda 10 mg twice a day. Son is interested in her trying the new IV drug leqembi   01/02/22: [name] is an 89 year old female with a history of memory disturbance.  She returns today for follow-up with her son.  She feels that her memory has remained stable.  She lives at home with her husband.  Aurther Loft her daughter helps with appointments and mediations. Able to complete all ADLS independently. Takes care of her little dog. Husband does the finances. Reports that she is sleeping well. No change in mood or behavior. Does not operate a motor vehicle. Last visit started on Aricept 10 mg at bedtime. Tolerating well.  She returns today for an evaluation  [name] is an 89 year old female with a history of memory disturbance.  She returns today for follow-up.  She had neuropsychological evaluation with Dr. Roseanne Reno.  The results of that test indicated a mild dementia perhaps Alzheimer's, vascular or a combination of both.  These results were reviewed with the [name] on September 6.  [name] currently lives at home with her husband.  It seems that they do not have a great relationship.   [name] is able to complete all ADLs independently.  She is able to complete household chores but they do have someone that comes in once a month to help.  Daughter reports that she is having some trouble managing her medications.  They are thinking of doing a pillbox or bubble pack.  Denies any significant changes in her mood or behavior.  Returns today for an evaluation.  HISTORY  [name] is a 89 y.o. female here as requested by [facility]* Dr. Zachery Dauer for memory loss.  I reviewed Dr. Zachery Dauer' notes: She has a past medical history of memory problems, postsurgical hypothyroidism, hypertension, hyperlipidemia, osteopenia.  She has high blood pressure but not checking her pressures at home, she has hyperlipidemia her diet is not as good as it could be, she likes bologna and cheese wraps, she usually does take out dinner including meat loaf mashed potato, does not like vegetables, has a history of thyroid cancer and is due for follow-up, [name]'s daughter accompanied her on the last visit and daughter was concerned that [name] may be having some memory issues, [name] has said that her husband is taking her things like her ID and her close, [name]'s husband says he is not but [name] denies hallucinations.  CMP, TSH, thyroglobulin antibodies, CBC and B12 were ordered at this appointment June 19, 2020, TSH 26.63, BUN 12, creatinine 0.98, otherwise CMP unremarkable, CBC normal, thyroglobulin antibody less than 1,  vitamin B12 296.     SHe lives with her husband, he thinks she is "going crazy", she has been married 60+ years, [name] says she thinks he is crazy. She doesn't think she is losing her memory. She has called him "asshole" but ongoing for 40 years. She went to her primary care and they did a memory test. She does forget things. The kids don;t live there so unsure what is really going on. Son hasn;t seen anything that is severe, she will forget things, she nmay forget some  conversations, She denies any memory loss.  She quit driving because of vision. She has a depth perception issue, she recognized she had to stop driving. She says her husband likes other women, this has been ongoing, he takes things from her to give to other women. He came in one day upset, and felt sorry for the girls he works with and for examples with take her coat and give them to other people. He hasn't done it lately because she told him not to. Now she "cusses him out". Son doesn't notice any personality changes. He says no changes in personality.Father dies in 33s, mother was 45, no memory issues.     Was able to speak with son alone: She forgets things, she loses things, she asks the same questions over and over again, she acuses her husband of taking things and she gets them back. Anything that goes missing she acuses her husband of taking it, even the remote and then she denies she put it there. Son states no personality changes, she has always called her husband jack ass, the problem is the short-term memory loss and the denial that she is losing things. She does not take her medications. She is not eating well. She hasn't been to the doctor in years. Started years ago. She does not prepare meals but this may be a choice, she can still make a baked banana pudding, not cooking is by choice, she will eat ceral for breakfast and she will eat sandwhich for lunch. Poor diet. Poor insight and judgement.    Reviewed notes, labs and imaging from outside physicians, which showed: see above    REVIEW OF SYSTEMS: Out of a complete 14 system review of symptoms, the [name] complains only of the following symptoms, and all other reviewed systems are negative.  ALLERGIES: Allergies  Allergen Reactions   Iodinated Contrast Media Anaphylaxis   Bextra [Valdecoxib] Nausea And Vomiting   Ceclor [Cefaclor] Nausea And Vomiting   Celebrex [Celecoxib] Nausea And Vomiting   Codeine Other (See Comments)    Makes  head feel like its going to explode   Demerol     Hallucinate    Doxycycline Nausea And Vomiting   Elemental Sulfur Nausea And Vomiting   Erythromycin Nausea And Vomiting   Meloxicam Nausea And Vomiting   Morphine And Codeine Nausea And Vomiting and Other (See Comments)    Loopy, knocked out   Penicillins Nausea And Vomiting    Has [name] had a PCN reaction causing immediate rash, facial/tongue/throat swelling, SOB or lightheadedness with hypotension: no Has [name] had a PCN reaction causing severe rash involving mucus membranes or skin necrosis: no Has [name] had a PCN reaction that required hospitalization: no Has [name] had a PCN reaction occurring within the last 10 years: no If all of the above answers are "NO", then may proceed with Cephalosporin use.    Scopolamine     Hallucinate    Tramadol Nausea And  Vomiting   Vicodin [Hydrocodone-Acetaminophen]     hallucinate   Vioxx [Rofecoxib] Nausea And Vomiting   Quinine Derivatives Rash    HOME MEDICATIONS: Outpatient Medications Prior to Visit  Medication Sig Dispense Refill   acetaminophen (TYLENOL) 325 MG tablet Take 2 tablets (650 mg total) by mouth every 6 (six) hours as needed. 15 tablet 0   alendronate (FOSAMAX) 10 MG tablet Take 10 mg by mouth daily.     bisoprolol (ZEBETA) 10 MG tablet Take 5 mg by mouth 2 (two) times daily.     cephALEXin (KEFLEX) 500 MG capsule Take 2 capsules (1,000 mg total) by mouth 2 (two) times daily. 28 capsule 0   donepezil (ARICEPT) 10 MG tablet TAKE 1 TABLET BY MOUTH EVERYDAY AT BEDTIME 90 tablet 1   memantine (NAMENDA) 10 MG tablet Take 1 tablet (10 mg total) by mouth 2 (two) times daily. 180 tablet 3   RESTASIS 0.05 % ophthalmic emulsion Place 1 drop into both eyes as needed.      SYNTHROID 75 MCG tablet Take 75 mcg by mouth daily.     valsartan-hydrochlorothiazide (DIOVAN-HCT) 160-25 MG per tablet Take 1 tablet by mouth daily.       No facility-administered medications prior to  visit.    PAST MEDICAL HISTORY: Past Medical History:  Diagnosis Date   Allergy    Bleeding nose    Hyperlipidemia    Hypertension    Kidney stones    Memory problem    Osteopenia    Postsurgical hypothyroidism    Thyroid cancer (HCC)    papillary    PAST SURGICAL HISTORY: Past Surgical History:  Procedure Laterality Date   ABDOMINAL HYSTERECTOMY     partial   APPENDECTOMY     BACK SURGERY     rod and screws   BREAST SURGERY     augmentation   CARDIAC CATHETERIZATION     CATARACT EXTRACTION     both   EYE SURGERY     both   LAMINECTOMY     NASAL SEPTUM SURGERY     OOPHORECTOMY     SHOULDER SURGERY     left and right   SINUS IRRIGATION     SUBACROMIAL DECOMPRESSION     THYROIDECTOMY  2012    FAMILY HISTORY: Family History  Problem Relation Age of Onset   Cancer Mother        breast   Stroke Mother    Heart disease Father    Cancer Father        lung   Asthma Father    Allergic rhinitis Father     SOCIAL HISTORY: Social History   Socioeconomic History   Marital status: Married    Spouse name: Not on file   Number of children: 4   Years of education: Not on file   Highest education level: Associate degree: occupational, Scientist, product/process development, or vocational program  Occupational History    Comment: retired  Tobacco Use   Smoking status: Never   Smokeless tobacco: Never  Vaping Use   Vaping status: Never Used  Substance and Sexual Activity   Alcohol use: Not Currently    Comment: rare, wine   Drug use: Never   Sexual activity: Not on file  Other Topics Concern   Not on file  Social History Narrative   08/23/20 lives with husband   Caffeine- 2 cups/day   Social Determinants of Health   Financial Resource Strain: Not on file  Food Insecurity: Not on file  Transportation Needs: Not on file  Physical Activity: Not on file  Stress: Not on file  Social Connections: Not on file  Intimate Partner Violence: Not on file      PHYSICAL EXAM  There  were no vitals filed for this visit.  There is no height or weight on file to calculate BMI.     07/09/2022    2:13 PM 01/02/2022    1:09 PM 08/23/2020    2:17 PM  MMSE - Mini Mental State Exam  Orientation to time 3 3 5   Orientation to Place 2 2 2   Registration 3 3 3   Attention/ Calculation 3 5 5   Recall 1 2 2   Language- name 2 objects 2 2 2   Language- repeat 1 1 0  Language- follow 3 step command 3 3 3   Language- read & follow direction 1 1 1   Write a sentence 1 1 1   Copy design 1 1 1   Total score 21 24 25      Generalized: Well developed, in no acute distress   Neurological examination  Mentation: Alert oriented to time, place, history taking. Follows all commands speech and language fluent Cranial nerve II-XII: Pupils were equal round reactive to light. Extraocular movements were full, visual field were full on confrontational test. Facial sensation and strength were normal. Uvula tongue midline. Head turning and shoulder shrug  were normal and symmetric. Motor: The motor testing reveals 5 over 5 strength of all 4 extremities. Good symmetric motor tone is noted throughout.  Sensory: Sensory testing is intact to soft touch on all 4 extremities. No evidence of extinction is noted.  Coordination: Cerebellar testing reveals good finger-nose-finger and heel-to-shin bilaterally.  Gait and station: Gait is normal.    DIAGNOSTIC DATA (LABS, IMAGING, TESTING) - I reviewed [name] records, labs, notes, testing and imaging myself where available.  Lab Results  Component Value Date   WBC 8.0 07/20/2022   HGB 12.6 07/20/2022   HCT 38.2 07/20/2022   MCV 87.2 07/20/2022   PLT 338 07/20/2022      Component Value Date/Time   NA 135 07/20/2022 1934   NA 140 08/23/2020 1507   K 3.8 07/20/2022 1934   CL 101 07/20/2022 1934   CO2 27 07/20/2022 1934   GLUCOSE 131 (H) 07/20/2022 1934   BUN 25 (H) 07/20/2022 1934   BUN 18 08/23/2020 1507   CREATININE 1.38 (H) 07/20/2022 1934    CALCIUM 9.1 07/20/2022 1934   PROT 7.0 12/21/2019 1034   ALBUMIN 4.0 12/21/2019 1034   AST 25 12/21/2019 1034   ALT 18 12/21/2019 1034   ALKPHOS 45 12/21/2019 1034   BILITOT 0.7 12/21/2019 1034   GFRNONAA 37 (L) 07/20/2022 1934   GFRAA 59 (L) 08/23/2020 1507     ASSESSMENT AND PLAN 89 y.o. year old female  has a past medical history of Allergy, Bleeding nose, Hyperlipidemia, Hypertension, Kidney stones, Memory problem, Osteopenia, Postsurgical hypothyroidism, and Thyroid cancer (HCC). here with:  1.  Mild dementia possibly Alzheimer's, vascular or both  -- Continue Aricept 10 mg at bedtime --Continue Namenda 10 mg twice a day -- Memory score is slightly decreased MMSE 21/30 --Follow-up in 6 months or sooner if needed     Butch Penny, MSN, NP-C 07/05/2023, 4:35 PM [facility] [address] [place], [place] [address] [phone]

## 2023-07-06 ENCOUNTER — Encounter: Payer: Self-pay | Admitting: Adult Health

## 2023-07-06 ENCOUNTER — Ambulatory Visit: Payer: Medicare Other | Admitting: Adult Health

## 2023-07-06 VITALS — BP 161/68 | HR 63 | Ht 63.0 in | Wt 132.0 lb

## 2023-07-06 DIAGNOSIS — F0280 Dementia in other diseases classified elsewhere without behavioral disturbance: Secondary | ICD-10-CM | POA: Diagnosis not present

## 2023-07-06 DIAGNOSIS — G309 Alzheimer's disease, unspecified: Secondary | ICD-10-CM

## 2023-07-06 MED ORDER — MEMANTINE HCL 10 MG PO TABS: 10.0000 mg | ORAL_TABLET | Freq: Two times a day (BID) | ORAL | 3 refills | Status: DC

## 2023-07-06 MED ORDER — DONEPEZIL HCL 10 MG PO TABS: 10.0000 mg | ORAL_TABLET | Freq: Every day | ORAL | 3 refills | Status: DC

## 2023-07-06 NOTE — Patient Instructions (Signed)
Your Plan:  Continue Aricept and namenda If your symptoms worsen or you develop new symptoms please let us know.   Thank you for coming to see [place] at [facility]. I hope we have been able to provide you high quality care today.  You may receive a patient satisfaction survey over the next few weeks. We would appreciate your feedback and comments so that we may continue to improve ourselves and the health of our patients.

## 2024-01-18 ENCOUNTER — Encounter: Payer: Self-pay | Admitting: Emergency Medicine

## 2024-01-18 ENCOUNTER — Ambulatory Visit
Admission: EM | Admit: 2024-01-18 | Discharge: 2024-01-18 | Disposition: A | Discharge status: Home / Self Care | Attending: Emergency Medicine | Admitting: Emergency Medicine

## 2024-01-18 ENCOUNTER — Other Ambulatory Visit: Payer: Self-pay

## 2024-01-18 DIAGNOSIS — R112 Nausea with vomiting, unspecified: Secondary | ICD-10-CM | POA: Diagnosis not present

## 2024-01-18 DIAGNOSIS — M546 Pain in thoracic spine: Secondary | ICD-10-CM | POA: Diagnosis not present

## 2024-01-18 MED ORDER — ONDANSETRON 4 MG PO TBDP: 4.0000 mg | ORAL_TABLET | Freq: Three times a day (TID) | ORAL | 0 refills | Status: AC | PRN

## 2024-01-18 MED ORDER — BACLOFEN 10 MG PO TABS: 5.0000 mg | ORAL_TABLET | Freq: Every evening | ORAL | 0 refills | Status: AC | PRN

## 2024-01-18 MED ORDER — DICLOFENAC SODIUM 1 % EX GEL: 4.0000 g | Freq: Four times a day (QID) | CUTANEOUS | 1 refills | Status: AC

## 2024-01-18 MED ORDER — DEXAMETHASONE SODIUM PHOSPHATE 10 MG/ML IJ SOLN
10.0000 mg | Freq: Once | INTRAMUSCULAR | Status: AC
  Administered 2024-01-18: 10 mg via INTRAMUSCULAR

## 2024-01-18 NOTE — ED Triage Notes (Signed)
 Noticed this morning she did not feel well.  Yesterday, patient felt fine.  Patient reports vomiting 2 times, mid back pain, left side is more painful than right side of back.  Denies urinary symptoms.  Had tylenol at 1:30 pm today.

## 2024-01-18 NOTE — Discharge Instructions (Addendum)
 Your evaluated for your back pain and nausea with vomiting  On exam there is no tenderness to the abdomen and bowel sounds are normal, low suspicion for an acute cause of an organ  Back pain is present over the top of the big back muscles and is most likely muscular, there is no spinal tenderness on exam and as there was no injury or fall low suspicion that there are changes to your spinal cord therefore we have held off on x-ray imaging  At this time you have no urinary symptoms indicating kidney stone or urinary infection as cause  Given injection of steroids to help reduce back pain, ideally will start to see improvement within the next hour  may use every 8 hours at home as needed, placed under tongue and allow to dissolve  You may use Voltaren gel every 6 hours as needed for pain  If having difficulty falling asleep due to discomfort and pain May use baclofen at bedtime as needed for comfort  May use ice or heat over the affected area in 10 to 15-minute intervals  May massage and stretch as tolerated  May place pillows behind the back whenever sitting or lying for additional comfort or support  If you begin to notice any changes or symptoms worsen please follow-up for reevaluation

## 2024-01-18 NOTE — ED Provider Notes (Signed)
 [name]    CSN: [id number] Arrival date & time: 01/18/24  1444      History   Chief Complaint Chief Complaint  Patient presents with   Emesis   Back Pain    HPI [name] is a 89 y.o. female.   Patient presents for evaluation of mid back pain and vomiting beginning today.  Back pain began 1st. left side worse than right, does not radiate, exacerbated by movement.  Has had 3 occurrences of vomiting with nausea.  Has not attempted to eat or drink since her current.  Has taken Tylenol which has been helpful.  Denies injury or trauma, numbness or tingling, urinary symptoms, diarrhea, abdominal pain or URI symptoms.  Past Medical History:  Diagnosis Date   Allergy    Bleeding nose    Hyperlipidemia    Hypertension    Kidney stones    Memory problem    Nasal fracture 2024   dog tripped her and she fell   Osteopenia    Postsurgical hypothyroidism    Thyroid cancer [facility])    papillary    Patient Active Problem List   Diagnosis Date Noted   Late onset Alzheimer's dementia with behavioral disturbance (HCC) 07/02/2021   Memory loss 08/26/2020   Drug reaction 07/13/2020   Thyroid cancer, multifocal papillary thryroid cancer 06/18/2011    Past Surgical History:  Procedure Laterality Date   ABDOMINAL HYSTERECTOMY     partial   APPENDECTOMY     BACK SURGERY     rod and screws   BREAST SURGERY     augmentation   CARDIAC CATHETERIZATION     CATARACT EXTRACTION     both   EYE SURGERY     both   LAMINECTOMY     NASAL SEPTUM SURGERY     OOPHORECTOMY     SHOULDER SURGERY     left and right   SINUS IRRIGATION     SUBACROMIAL DECOMPRESSION     THYROIDECTOMY  2012    OB History   No obstetric history on file.      Home Medications    Prior to Admission medications   Medication Sig Start Date End Date Taking? Authorizing Provider  baclofen (LIORESAL) 10 MG tablet Take 0.5 tablets (5 mg total) by mouth at bedtime as needed for muscle spasms.  01/18/24  Yes Athenia Rys R, NP  diclofenac Sodium (VOLTAREN) 1 % GEL Apply 4 g topically 4 (four) times daily. 01/18/24  Yes Betzabe Bevans R, NP  ondansetron (ZOFRAN-ODT) 4 MG disintegrating tablet Take 1 tablet (4 mg total) by mouth every 8 (eight) hours as needed. 01/18/24  Yes Dafina Suk, Elita Boone, NP  acetaminophen (TYLENOL) 325 MG tablet Take 2 tablets (650 mg total) by mouth every 6 (six) hours as needed. 01/13/19   Khatri, Hina, PA-C  bisoprolol (ZEBETA) 10 MG tablet Take 5 mg by mouth 2 (two) times daily. Patient not taking: Reported on 07/06/2023 10/12/16   [provider]  donepezil (ARICEPT) 10 MG tablet Take 1 tablet (10 mg total) by mouth at bedtime. 07/06/23   Butch Penny, NP  memantine (NAMENDA) 10 MG tablet Take 1 tablet (10 mg total) by mouth 2 (two) times daily. 07/06/23   Butch Penny, NP  RESTASIS 0.05 % ophthalmic emulsion Place 1 drop into both eyes as needed.  01/17/12   [provider]  SYNTHROID 75 MCG tablet Take 75 mcg by mouth daily. 09/12/16   [provider]  valsartan-hydrochlorothiazide (DIOVAN-HCT)  160-25 MG per tablet Take 1 tablet by mouth daily.      [provider]    Family History Family History  Problem Relation Age of Onset   Cancer Mother        breast   Stroke Mother    Heart disease Father    Cancer Father        lung   Asthma Father    Allergic rhinitis Father     Social History Social History   Tobacco Use   Smoking status: Never   Smokeless tobacco: Never  Vaping Use   Vaping status: Never Used  Substance Use Topics   Alcohol use: Not Currently    Comment: rare, wine   Drug use: Never     Allergies   Iodinated contrast media, Bextra [valdecoxib], Ceclor [cefaclor], Celebrex [celecoxib], Codeine, Demerol, Doxycycline, Elemental sulfur, Erythromycin, Meloxicam, Morphine and codeine, Penicillins, Scopolamine, Tramadol, Vicodin [hydrocodone-acetaminophen], Vioxx [rofecoxib], and Quinine  derivatives   Review of Systems Review of Systems   Physical Exam Triage Vital Signs ED Triage Vitals  Encounter Vitals Group     BP 01/18/24 1643 (!) 154/77     Systolic BP Percentile --      Diastolic BP Percentile --      Pulse Rate 01/18/24 1643 69     Resp 01/18/24 1643 16     Temp 01/18/24 1643 98.5 F (36.9 C)     Temp Source 01/18/24 1643 Oral     SpO2 01/18/24 1643 96 %     Weight --      Height --      Head Circumference --      Peak Flow --      Pain Score 01/18/24 1636 7     Pain Loc --      Pain Education --      Exclude from Growth Chart --    No data found.  Updated Vital Signs BP (!) 154/77 (BP Location: Left Arm)   Pulse 69   Temp 98.5 F (36.9 C) (Oral)   Resp 16   SpO2 96%   Visual Acuity Right Eye Distance:   Left Eye Distance:   Bilateral Distance:    Right Eye Near:   Left Eye Near:    Bilateral Near:     Physical Exam Constitutional:      Appearance: Normal appearance.  Eyes:     Extraocular Movements: Extraocular movements intact.  Pulmonary:     Effort: Pulmonary effort is normal.  Abdominal:     Tenderness: There is no abdominal tenderness. There is no right CVA tenderness, left CVA tenderness or guarding.  Musculoskeletal:     Comments: Tenderness present to the bilateral thoracic region, no ecchymosis swelling or deformity, no spinal tenderness  Neurological:     Mental Status: She is alert and oriented to person, place, and time. Mental status is at baseline.      UC Treatments / Results  Labs (all labs ordered are listed, but only abnormal results are displayed) Labs Reviewed - No data to display  EKG   Radiology No results found.  Procedures Procedures (including critical care time)  Medications Ordered in UC Medications  dexamethasone (DECADRON) injection 10 mg (10 mg Intramuscular Given 01/18/24 1700)    Initial Impression / Assessment and Plan / UC Course  I have reviewed the triage vital signs and  the nursing notes.  Pertinent labs & imaging results that were available during my care of the patient  were reviewed by me and considered in my medical decision making (see chart for details).  Acute bilateral thoracic back pain, nausea and vomiting  Vitals are stable, patient in no signs of distress nontoxic-appearing, no abdominal tenderness or increased bowel sounds noted on exam, low suspicion for acutely inflamed organ, low suspicion for UTI or kidney stone as she denies all urinary symptoms, tenderness to the back is over the upper latissimus dorsi, etiology most likely muscular, no spinal tenderness nor injury therefore imaging deferred, discussed with patient and family, stable for outpatient management we will move forward with symptomatic treatment, Decadron IM given and prescribed Zofran, diclofenac topical and baclofen for home use recommended supportive care with follow-up as needed if symptoms persist worsen or recur Final Clinical Impressions(s) / UC Diagnoses   Final diagnoses:  Acute bilateral thoracic back pain  Nausea and vomiting, unspecified vomiting type     Discharge Instructions      Your evaluated for your back pain and nausea with vomiting  On exam there is no tenderness to the abdomen and bowel sounds are normal, low suspicion for an acute cause of an organ  Back pain is present over the top of the big back muscles and is most likely muscular, there is no spinal tenderness on exam and as there was no injury or fall low suspicion that there are changes to your spinal cord therefore we have held off on x-ray imaging  At this time you have no urinary symptoms indicating kidney stone or urinary infection as cause  Given injection of steroids to help reduce back pain, ideally will start to see improvement within the next hour  may use every 8 hours at home as needed, placed under tongue and allow to dissolve  You may use Voltaren gel every 6 hours as needed for  pain  If having difficulty falling asleep due to discomfort and pain May use baclofen at bedtime as needed for comfort  May use ice or heat over the affected area in 10 to 15-minute intervals  May massage and stretch as tolerated  May place pillows behind the back whenever sitting or lying for additional comfort or support  If you begin to notice any changes or symptoms worsen please follow-up for reevaluation    ED Prescriptions     Medication Sig Dispense Auth. Provider   diclofenac Sodium (VOLTAREN) 1 % GEL Apply 4 g topically 4 (four) times daily. 100 g Taeja Debellis, Hansel Starling R, NP   baclofen (LIORESAL) 10 MG tablet Take 0.5 tablets (5 mg total) by mouth at bedtime as needed for muscle spasms. 10 each Jenika Chiem R, NP   ondansetron (ZOFRAN-ODT) 4 MG disintegrating tablet Take 1 tablet (4 mg total) by mouth every 8 (eight) hours as needed. 10 tablet Valinda Hoar, NP      PDMP not reviewed this encounter.   Valinda Hoar, NP 01/18/24 1750

## 2024-03-09 ENCOUNTER — Ambulatory Visit: Payer: Medicare Other | Admitting: Adult Health

## 2024-03-28 ENCOUNTER — Telehealth: Payer: Self-pay | Admitting: Adult Health

## 2024-03-28 NOTE — Telephone Encounter (Signed)
 Pt's daughter, Ami Kail called to verify appointment

## 2024-03-29 ENCOUNTER — Encounter: Payer: Self-pay | Admitting: Neurology

## 2024-03-29 ENCOUNTER — Ambulatory Visit: Admitting: Neurology

## 2024-03-29 VITALS — BP 127/68 | HR 63 | Ht 62.0 in | Wt 129.4 lb

## 2024-03-29 DIAGNOSIS — F03918 Unspecified dementia, unspecified severity, with other behavioral disturbance: Secondary | ICD-10-CM | POA: Diagnosis not present

## 2024-03-29 NOTE — Patient Instructions (Addendum)
 Start zoloft 25mg  and slowly increase to 50mg  in 2 weeks Touch base in 4 weeks to see how it is going   Sertraline Tablets What is this medication? SERTRALINE (SER tra leen) treats depression, anxiety, obsessive-compulsive disorder (OCD), post-traumatic stress disorder (PTSD), and premenstrual dysphoric disorder (PMDD). It increases the amount of serotonin in the brain, a hormone that helps regulate mood. It belongs to a group of medications called SSRIs. This medicine may be used for other purposes; ask your health care provider or pharmacist if you have questions. COMMON BRAND NAME(S): Zoloft What should I tell my care team before I take this medication? They need to know if you have any of these conditions: Bleeding disorders Bipolar disorder or a family history of bipolar disorder Frequently drink alcohol Glaucoma Heart disease High blood pressure History of irregular heartbeat History of low levels of calcium, magnesium, or potassium in the blood Liver disease Receiving electroconvulsive therapy Seizures Suicidal thoughts, plans, or attempt by you or a family member Take medications that prevent or treat blood clots Thyroid  disease An unusual or allergic reaction to sertraline, other medications, foods, dyes, or preservatives Pregnant or trying to get pregnant Breastfeeding How should I use this medication? Take this medication by mouth with a glass of water. Take it as directed on the prescription label at the same time every day. You can take it with or without food. If it upsets your stomach, take it with food. Do not take your medication more often than directed. Keep taking this medication unless your care team tells you to stop. Stopping it too quickly can cause serious side effects. It can also make your condition worse. A special MedGuide will be given to you by the pharmacist with each prescription and refill. Be sure to read this information carefully each time. Talk to  your care team about the use of this medication in children. While it may be prescribed for children as young as 7 years for selected conditions, precautions do apply. Overdosage: If you think you have taken too much of this medicine contact a poison control center or emergency room at once. NOTE: This medicine is only for you. Do not share this medicine with others. What if I miss a dose? If you miss a dose, take it as soon as you can. If it is almost time for your next dose, take only that dose. Do not take double or extra doses. What may interact with this medication? Do not take this medication with any of the following: Cisapride Dronedarone Linezolid MAOIs, such as Carbex, Eldepryl, Marplan, Nardil, and Parnate Methylene blue (injected into a vein) Pimozide Thioridazine This medication may also interact with the following: Alcohol Amphetamines Aspirin and aspirin-like medications Certain medications for fungal infections, such as ketoconazole, fluconazole, posaconazole, itraconazole Certain medications for irregular heart beat, such as flecainide, quinidine, propafenone Certain medications for mental health conditions Certain medications for migraine headaches, such as almotriptan, eletriptan, frovatriptan, naratriptan, rizatriptan, sumatriptan, zolmitriptan Certain medications for seizures, such as carbamazepine, valproic acid, phenytoin Certain medications for sleep Certain medications that prevent or treat blood clots, such as warfarin, enoxaparin, dalteparin Cimetidine Digoxin Diuretics Fentanyl  Isoniazid Lithium NSAIDs, medications for pain and inflammation, such as ibuprofen or naproxen Other medications that cause heart rhythm changes, such as dofetilide Rasagiline Safinamide Supplements, such as St. John's wort, kava kava, valerian Tolbutamide Tramadol Tryptophan This list may not describe all possible interactions. Give your health care provider a list of all the  medicines, herbs, non-prescription drugs,  or dietary supplements you use. Also tell them if you smoke, drink alcohol, or use illegal drugs. Some items may interact with your medicine. What should I watch for while using this medication? Tell your care team if your symptoms do not get better or if they get worse. Visit your care team for regular checks on your progress. Because it may take several weeks to see the full effects of this medication, it is important to continue your treatment as prescribed by your care team. Patients and their families should watch out for new or worsening thoughts of suicide or depression. Also watch out for sudden changes in feelings such as feeling anxious, agitated, panicky, irritable, hostile, aggressive, impulsive, severely restless, overly excited and hyperactive, or not being able to sleep. If this happens, especially at the beginning of treatment or after a change in dose, call your care team. This medication may affect your coordination, reaction time, or judgment. Do not drive or operate machinery until you know how this medication affects you. Sit or stand up slowly to reduce the risk of dizzy or fainting spells. Drinking alcohol with this medication can increase the risk of these side effects. Your mouth may get dry. Chewing sugarless gum or sucking hard candy, and drinking plenty of water may help. Contact your care team if the problem does not go away or is severe. What side effects may I notice from receiving this medication? Side effects that you should report to your care team as soon as possible: Allergic reactions--skin rash, itching, hives, swelling of the face, lips, tongue, or throat Bleeding--bloody or black, tar-like stools, red or dark brown urine, vomiting blood or brown material that looks like coffee grounds, small red or purple spots on skin, unusual bleeding or bruising Heart rhythm changes--fast or irregular heartbeat, dizziness, feeling faint or  lightheaded, chest pain, trouble breathing Low sodium level--muscle weakness, fatigue, dizziness, headache, confusion Serotonin syndrome--irritability, confusion, fast or irregular heartbeat, muscle stiffness, twitching muscles, sweating, high fever, seizure, chills, vomiting, diarrhea Sudden eye pain or change in vision such as blurred vision, seeing halos around lights, vision loss Thoughts of suicide or self-harm, worsening mood Side effects that usually do not require medical attention (report these to your care team if they continue or are bothersome): Change in sex drive or performance Diarrhea Excessive sweating Nausea Tremors or shaking Upset stomach This list may not describe all possible side effects. Call your doctor for medical advice about side effects. You may report side effects to FDA at 1-800-FDA-1088. Where should I keep my medication? Keep out of the reach of children and pets. Store at room temperature between 20 and 25 degrees C (68 and 77 degrees F). Get rid of any unused medication after the expiration date. To get rid of medications that are no longer needed or expired: Take the medication to a medication take-back program. Check with your pharmacy or law enforcement to find a location. If you cannot return the medication, check the label or package insert to see if the medication should be thrown out in the garbage or flushed down the toilet. If you are not sure, ask your care team. If it is safe to put in the trash, empty the medication out of the container. Mix the medication with cat litter, dirt, coffee grounds, or other unwanted substance. Seal the mixture in a bag or container. Put it in the trash. NOTE: This sheet is a summary. It may not cover all possible information. If you have questions  about this medicine, talk to your doctor, pharmacist, or health care provider.  2024 [employer] Standard (2022-05-13 00:00:00)

## 2024-03-29 NOTE — Progress Notes (Unsigned)
 PATIENT: [name] DOB: 25-Jun-1931  REASON FOR VISIT: follow up HISTORY FROM: patient PRIMARY NEUROLOGIST: Dr. Tresia Fruit  Chief Complaint  Patient presents with   Follow-up    Pt in 9 with daughter Pt here for memory f/u  daighter states short term memory is worse Pt agitated during rooming process     CC: memory loss, depression  03/29/2024: Here for follow up on memory loss with daughter/dementia with behavioral problems. Patient hides a lot of things. Daughter states she didn;t want to take her medications and they have addressed. She will shower and dress independently although they have to remind her to shower and prompt her to shower. She fixes her own breakfast. Sits in a chair all day with her dog. She won;t get out of the house. Was hard to get her here today. Later appointments are better. Still lives with her 55 year old husband, he still drives, husband's memory is fine, he is there all the time to watch her . 2 daughters and 2 sons, daughters visit, father will call if needs. She misses things and thinks people took it. She has a Haematologist under her bed. Her daughter's email address is [email or web address].    HISTORY OF PRESENT ILLNESS: 07/06/2023:   [name] is a 89 y.o. female with a history of Memory disturbance. Returns today for follow-up. Remains on Aricept  10 mg daily and Namenda  10 mg twice a day.  Reports that she tolerates the medicine well.  She continues to live at home with her husband.  Able to complete all ADLs independently.  She no longer cooks.  Has not driven in 20 years.  Denies any significant changes in her mood or behavior.  Returns today for follow-up with her daughter.   07/09/22: [name] is an 89 year old female with a history of memory disturbance.  She returns today for follow-up.  She feels that overall her memory has remained stable.  She is here today with her son.  She is able to complete all ADLs independently.  Her daughter continues  to help her with her appointments and medications.  Husband manages the finances.  Denies any change in her mood or behavior.  Reports that she sleeps well.  Good appetite.  Remains on Aricept  10 mg at bedtime and Namenda  10 mg twice a day. Son is interested in her trying the new IV drug leqembi   01/02/22: [name] is an 89 year old female with a history of memory disturbance.  She returns today for follow-up with her son.  She feels that her memory has remained stable.  She lives at home with her husband.  [name] her daughter helps with appointments and mediations. Able to complete all ADLS independently. Takes care of her little dog. Husband does the finances. Reports that she is sleeping well. No change in mood or behavior. Does not operate a motor vehicle. Last visit started on Aricept  10 mg at bedtime. Tolerating well.  She returns today for an evaluation  [name] is an 89 year old female with a history of memory disturbance.  She returns today for follow-up.  She had neuropsychological evaluation with Dr. Annette Barters.  The results of that test indicated a mild dementia perhaps Alzheimer's, vascular or a combination of both.  These results were reviewed with the patient on September 6.  Patient currently lives at home with her husband.  It seems that they do not have a great relationship.  Patient is able to complete all ADLs independently.  She is able to complete household chores but they do have someone that comes in once a month to help.  Daughter reports that she is having some trouble managing her medications.  They are thinking of doing a pillbox or bubble pack.  Denies any significant changes in her mood or behavior.  Returns today for an evaluation.  HISTORY  [name] is a 89 y.o. female here as requested by [facility]* Dr. Langston Pippins for memory loss.  I reviewed Dr. Langston Pippins' notes: She has a past medical history of memory problems, postsurgical hypothyroidism, hypertension,  hyperlipidemia, osteopenia.  She has high blood pressure but not checking her pressures at home, she has hyperlipidemia her diet is not as good as it could be, she likes bologna and cheese wraps, she usually does take out dinner including meat loaf mashed potato, does not like vegetables, has a history of thyroid  cancer and is due for follow-up, patient's daughter accompanied her on the last visit and daughter was concerned that patient may be having some memory issues, patient has said that her husband is taking her things like her ID and her close, patient's husband says he is not but patient denies hallucinations.  CMP, TSH, thyroglobulin antibodies, CBC and B12 were ordered at this appointment June 19, 2020, TSH 26.63, BUN 12, creatinine 0.98, otherwise CMP unremarkable, CBC normal, thyroglobulin antibody less than 1, vitamin B12 296.     SHe lives with her husband, he thinks she is "going crazy", she has been married 60+ years, patient says she thinks he is crazy. She doesn't think she is losing her memory. She has called him "asshole" but ongoing for 40 years. She went to her primary care and they did a memory test. She does forget things. The kids don;t live there so unsure what is really going on. Son hasn;t seen anything that is severe, she will forget things, she nmay forget some conversations, She denies any memory loss.  She quit driving because of vision. She has a depth perception issue, she recognized she had to stop driving. She says her husband likes other women, this has been ongoing, he takes things from her to give to other women. He came in one day upset, and felt sorry for the girls he works with and for examples with take her coat and give them to other people. He hasn't done it lately because she told him not to. Now she "cusses him out". Son doesn't notice any personality changes. He says no changes in personality.Father dies in 4s, mother was 47, no memory issues.     Was able to  speak with son alone: She forgets things, she loses things, she asks the same questions over and over again, she acuses her husband of taking things and she gets them back. Anything that goes missing she acuses her husband of taking it, even the remote and then she denies she put it there. Son states no personality changes, she has always called her husband jack ass, the problem is the short-term memory loss and the denial that she is losing things. She does not take her medications. She is not eating well. She hasn't been to the doctor in years. Started years ago. She does not prepare meals but this may be a choice, she can still make a baked banana pudding, not cooking is by choice, she will eat ceral for breakfast and she will eat sandwhich for lunch. Poor diet. Poor insight and judgement.  Reviewed notes, labs and imaging from outside physicians, which showed: see above    REVIEW OF SYSTEMS: Out of a complete 14 system review of symptoms, the patient complains only of the following symptoms, and all other reviewed systems are negative.  ALLERGIES: Allergies  Allergen Reactions   Iodinated Contrast Media Anaphylaxis   Bextra [Valdecoxib] Nausea And Vomiting   Ceclor [Cefaclor] Nausea And Vomiting   Celebrex [Celecoxib] Nausea And Vomiting   Codeine Other (See Comments)    Makes head feel like its going to explode   Demerol     Hallucinate    Doxycycline Nausea And Vomiting   Elemental Sulfur Nausea And Vomiting   Erythromycin Nausea And Vomiting   Meloxicam Nausea And Vomiting   Morphine And Codeine Nausea And Vomiting and Other (See Comments)    Loopy, knocked out   Penicillins Nausea And Vomiting    Has patient had a PCN reaction causing immediate rash, facial/tongue/throat swelling, SOB or lightheadedness with hypotension: no Has patient had a PCN reaction causing severe rash involving mucus membranes or skin necrosis: no Has patient had a PCN reaction that required  hospitalization: no Has patient had a PCN reaction occurring within the last 10 years: no If all of the above answers are "NO", then may proceed with Cephalosporin use.    Scopolamine     Hallucinate    Tramadol Nausea And Vomiting   Vicodin [Hydrocodone-Acetaminophen ]     hallucinate   Vioxx [Rofecoxib] Nausea And Vomiting   Quinine Derivatives Rash    HOME MEDICATIONS: Outpatient Medications Prior to Visit  Medication Sig Dispense Refill   acetaminophen  (TYLENOL ) 325 MG tablet Take 2 tablets (650 mg total) by mouth every 6 (six) hours as needed. 15 tablet 0   donepezil  (ARICEPT ) 10 MG tablet Take 1 tablet (10 mg total) by mouth at bedtime. 90 tablet 3   memantine  (NAMENDA ) 10 MG tablet Take 1 tablet (10 mg total) by mouth 2 (two) times daily. 180 tablet 3   SYNTHROID 75 MCG tablet Take 75 mcg by mouth daily.     valsartan-hydrochlorothiazide (DIOVAN-HCT) 160-25 MG per tablet Take 1 tablet by mouth daily.       baclofen  (LIORESAL ) 10 MG tablet Take 0.5 tablets (5 mg total) by mouth at bedtime as needed for muscle spasms. 10 each 0   bisoprolol (ZEBETA) 10 MG tablet Take 5 mg by mouth 2 (two) times daily. (Patient not taking: Reported on 07/06/2023)     diclofenac  Sodium (VOLTAREN ) 1 % GEL Apply 4 g topically 4 (four) times daily. 100 g 1   ondansetron  (ZOFRAN -ODT) 4 MG disintegrating tablet Take 1 tablet (4 mg total) by mouth every 8 (eight) hours as needed. 10 tablet 0   RESTASIS 0.05 % ophthalmic emulsion Place 1 drop into both eyes as needed.      No facility-administered medications prior to visit.    PAST MEDICAL HISTORY: Past Medical History:  Diagnosis Date   Allergy    Bleeding nose    Hyperlipidemia    Hypertension    Kidney stones    Memory problem    Nasal fracture 2024   dog tripped her and she fell   Osteopenia    Postsurgical hypothyroidism    Thyroid  cancer (HCC)    papillary    PAST SURGICAL HISTORY: Past Surgical History:  Procedure Laterality Date    ABDOMINAL HYSTERECTOMY     partial   APPENDECTOMY     BACK SURGERY     rod  and screws   BREAST SURGERY     augmentation   CARDIAC CATHETERIZATION     CATARACT EXTRACTION     both   EYE SURGERY     both   LAMINECTOMY     NASAL SEPTUM SURGERY     OOPHORECTOMY     SHOULDER SURGERY     left and right   SINUS IRRIGATION     SUBACROMIAL DECOMPRESSION     THYROIDECTOMY  2012    FAMILY HISTORY: Family History  Problem Relation Age of Onset   Cancer Mother        breast   Stroke Mother    Heart disease Father    Cancer Father        lung   Asthma Father    Allergic rhinitis Father    Dementia Neg Hx    Alzheimer's disease Neg Hx     SOCIAL HISTORY: Social History   Socioeconomic History   Marital status: Married    Spouse name: Not on file   Number of children: 4   Years of education: Not on file   Highest education level: Associate degree: occupational, Scientist, product/process development, or vocational program  Occupational History    Comment: retired  Tobacco Use   Smoking status: Never   Smokeless tobacco: Never  Vaping Use   Vaping status: Never Used  Substance and Sexual Activity   Alcohol use: Not Currently    Comment: rare, wine   Drug use: Never   Sexual activity: Not on file  Other Topics Concern   Not on file  Social History Narrative   08/23/20 lives with husband   Caffeine- 1 cup/day   Retired    Chief Executive Officer Drivers of Corporate investment banker Strain: Low Risk  (08/17/2023)   Received from [facility]   Overall Financial Resource Strain (CARDIA)    Difficulty of Paying Living Expenses: Not hard at all  Food Insecurity: No Food Insecurity (08/17/2023)   Received from [facility]   Hunger Vital Sign    Worried About Running Out of Food in the Last Year: Never true    Ran Out of Food in the Last Year: Never true  Transportation Needs: No Transportation Needs (08/17/2023)   Received from [facility] -  Transportation    In the past 12 months, has lack of transportation kept you from medical appointments or from getting medications?: No    Lack of Transportation (Non-Medical): No  Physical Activity: Not on file  Stress: Not on file  Social Connections: Not on file  Intimate Partner Violence: Not on file      PHYSICAL EXAM  Vitals:   03/29/24 1122  BP: 127/68  Pulse: 63  Weight: 129 lb 6.4 oz (58.7 kg)  Height: 5\' 2"  (1.575 m)     Body mass index is 23.67 kg/m.    Physical exam: Exam: Gen: NAD, conversant      CV: No palpitations or chest pain or SOB. VS: Breathing at a normal rate. Not febrile. Eyes: Conjunctivae clear without exudates or hemorrhage  Neuro: Detailed Neurologic Exam  Speech:    Speech is normal; fluent and spontaneous with normal comprehension.  Cognition:    03/29/2024   11:27 AM 07/06/2023    1:53 PM 07/09/2022    2:13 PM  MMSE - Mini Mental State Exam  Orientation to time 1 2 3   Orientation to Place 1 4 2   Registration 3 3  3  Attention/ Calculation 4 3 3   Recall 1 0 1  Language- name 2 objects 2 2 2   Language- repeat 1 1 1   Language- follow 3 step command 3 3 3   Language- read & follow direction 1 1 1   Write a sentence 1 0 1  Copy design 1 1 1   Total score 19 20 21     Cranial Nerves:    The pupils are equal, round, and reactive to light. Visual fields are full Extraocular movements are intact.  The face is symmetric with normal sensation. The palate elevates in the midline. Hearing intact. Voice is normal. Shoulder shrug is normal. The tongue has normal motion without fasciculations.   Coordination: normal  Gait:    No abnormalities noted or reported  Motor Observation:   no involuntary movements noted. Tone:    Appears normal  Posture:    Posture is normal. normal erect    Strength:    Strength is anti-gravity and symmetric in the upper and lower limbs.      Sensation: intact to LT, no reports of numbness or tingling or  paresthesias        DIAGNOSTIC DATA (LABS, IMAGING, TESTING) - I reviewed patient records, labs, notes, testing and imaging myself where available.  Lab Results  Component Value Date   WBC 10.7 03/29/2024   HGB 13.7 03/29/2024   HCT 42.3 03/29/2024   MCV 95 03/29/2024   PLT 292 03/29/2024      Component Value Date/Time   NA 139 03/29/2024 1250   K 4.6 03/29/2024 1250   CL 98 03/29/2024 1250   CO2 25 03/29/2024 1250   GLUCOSE 100 (H) 03/29/2024 1250   GLUCOSE 131 (H) 07/20/2022 1934   BUN 24 03/29/2024 1250   CREATININE 1.37 (H) 03/29/2024 1250   CALCIUM 10.0 03/29/2024 1250   PROT 7.2 03/29/2024 1250   ALBUMIN 4.5 03/29/2024 1250   AST 19 03/29/2024 1250   ALT 18 03/29/2024 1250   ALKPHOS 99 03/29/2024 1250   BILITOT 0.5 03/29/2024 1250   GFRNONAA 37 (L) 07/20/2022 1934   GFRAA 59 (L) 08/23/2020 1507     ASSESSMENT AND PLAN 89 y.o. year old female  has a past medical history of Allergy, Bleeding nose, Hyperlipidemia, Hypertension, Kidney stones, Memory problem, Nasal fracture (2024), Osteopenia, Postsurgical hypothyroidism, and Thyroid  cancer (HCC). here with:  1.  Mild dementia possibly Alzheimer's, vascular or both  -- Continue Aricept  10 mg at bedtime --Continue Namenda  10 mg twice a day -- Memory score is slightly decreased MMSE 19/30 (21 in 2023) - we discussed her behavioral problems and depression which can be related to dementia and often times is.  - stressed quality of life, strategies for management - Start zoloft 25mg  and slowly increase to 50mg  in 2 weeks - Touch base in 4 weeks to see how it is going - will check some labs, while patient waiting in lab area was able to speak alone with daughter, very difficult for families of patients with dementia  Orders Placed This Encounter  Procedures   Culture, Urine   Microscopic Examination   CBC with Differential/Platelets   Comprehensive metabolic panel with GFR   TSH Rfx on Abnormal to Free T4    Urinalysis, Routine w reflex microscopic   T4F    Meds ordered this encounter  Medications   sertraline (ZOLOFT) 25 MG tablet    Sig: Start with one pill at bedtime and, if no side effects in 2 weeks, increase  to 2 pills at bedtime    Dispense:  60 tablet    Refill:  6      Aldona Amel [facility] [address], [address] [place], [place] [address] [phone]  I spent 32 minutes of face-to-face and non-face-to-face time with patient on the  1. Dementia with other behavioral disturbance, unspecified dementia severity, unspecified dementia type (HCC)    diagnosis.  This included previsit chart review, lab review, study review, order entry, electronic health record documentation, patient education on the different diagnostic and therapeutic options, counseling and coordination of care, risks and benefits of management, compliance, or risk factor reduction

## 2024-03-30 LAB — CBC WITH DIFFERENTIAL/PLATELET
Basophils Absolute: 0.1 10*3/uL (ref 0.0–0.2)
Basos: 1 %
EOS (ABSOLUTE): 0.2 10*3/uL (ref 0.0–0.4)
Eos: 2 %
Hematocrit: 42.3 % (ref 34.0–46.6)
Hemoglobin: 13.7 g/dL (ref 11.1–15.9)
Immature Grans (Abs): 0 10*3/uL (ref 0.0–0.1)
Immature Granulocytes: 0 %
Lymphocytes Absolute: 3.4 10*3/uL — ABNORMAL HIGH (ref 0.7–3.1)
Lymphs: 32 %
MCH: 30.8 pg (ref 26.6–33.0)
MCHC: 32.4 g/dL (ref 31.5–35.7)
MCV: 95 fL (ref 79–97)
Monocytes Absolute: 1 10*3/uL — ABNORMAL HIGH (ref 0.1–0.9)
Monocytes: 10 %
Neutrophils Absolute: 6 10*3/uL (ref 1.4–7.0)
Neutrophils: 55 %
Platelets: 292 10*3/uL (ref 150–450)
RBC: 4.45 x10E6/uL (ref 3.77–5.28)
RDW: 13.5 % (ref 11.7–15.4)
WBC: 10.7 10*3/uL (ref 3.4–10.8)

## 2024-03-30 LAB — MICROSCOPIC EXAMINATION
Casts: NONE SEEN /LPF
Epithelial Cells (non renal): NONE SEEN /HPF (ref 0–10)
RBC, Urine: NONE SEEN /HPF (ref 0–2)

## 2024-03-30 LAB — COMPREHENSIVE METABOLIC PANEL WITH GFR
ALT: 18 IU/L (ref 0–32)
AST: 19 IU/L (ref 0–40)
Albumin: 4.5 g/dL (ref 3.6–4.6)
Alkaline Phosphatase: 99 IU/L (ref 44–121)
BUN/Creatinine Ratio: 18 (ref 12–28)
BUN: 24 mg/dL (ref 10–36)
Bilirubin Total: 0.5 mg/dL (ref 0.0–1.2)
CO2: 25 mmol/L (ref 20–29)
Calcium: 10 mg/dL (ref 8.7–10.3)
Chloride: 98 mmol/L (ref 96–106)
Creatinine, Ser: 1.37 mg/dL — ABNORMAL HIGH (ref 0.57–1.00)
Globulin, Total: 2.7 g/dL (ref 1.5–4.5)
Glucose: 100 mg/dL — ABNORMAL HIGH (ref 70–99)
Potassium: 4.6 mmol/L (ref 3.5–5.2)
Sodium: 139 mmol/L (ref 134–144)
Total Protein: 7.2 g/dL (ref 6.0–8.5)
eGFR: 36 mL/min/{1.73_m2} — ABNORMAL LOW (ref >59)

## 2024-03-30 LAB — URINALYSIS, ROUTINE W REFLEX MICROSCOPIC
Bilirubin, UA: NEGATIVE
Glucose, UA: NEGATIVE
Ketones, UA: NEGATIVE
Nitrite, UA: NEGATIVE
Protein,UA: NEGATIVE
RBC, UA: NEGATIVE
Specific Gravity, UA: 1.013 (ref 1.005–1.030)
Urobilinogen, Ur: 0.2 mg/dL (ref 0.2–1.0)
pH, UA: 6.5 (ref 5.0–7.5)

## 2024-03-30 LAB — TSH RFX ON ABNORMAL TO FREE T4: TSH: 56.9 u[IU]/mL — ABNORMAL HIGH (ref 0.450–4.500)

## 2024-03-30 LAB — T4F: T4,Free (Direct): 1.01 ng/dL (ref 0.82–1.77)

## 2024-03-30 MED ORDER — SERTRALINE HCL 25 MG PO TABS: ORAL_TABLET | ORAL | 6 refills | Status: AC

## 2024-04-01 LAB — URINE CULTURE

## 2024-04-04 ENCOUNTER — Telehealth: Payer: Self-pay | Admitting: Neurology

## 2024-04-04 NOTE — Telephone Encounter (Signed)
 error

## 2024-04-04 NOTE — Telephone Encounter (Signed)
   This is a patient we see for dementia who came in last week with behavioral changes so I checked a urinalysis.  Results showed 1+ leukocytes, 6-10 white blood cells and Aerococcus urinary 100,000 colonies.  However I am not sure that this is not contamination, not sure she was given a sterile cup or something to wipe herself with.  Also this particular bacteria is susceptible to penicillins which patient is allergic to, Macrobid which patient cannot take due to low GFR 25 and cefepime which is IV only.  So we have no good antibiotic choices anyway.  I spoke to daughter tonight patient is doing well.  I think that she can repeat urinalysis and culture at Dr. Jonette Nestle office if he is amenable to ordering it so she can go into their office in [place] and get it done.  Please call Dr. Jonette Nestle office patient's pcp and see if he can order a repeat urinalysis and culture. And Then call daughter Anselmo Kings [phone], patient does not seem to be declining or symptomatic so I think that anytime this week would be fine.  But if she does have altered mental status or any other concerning changes it might be urinary tract infection and they should take her to the emergency room.

## 2024-04-05 NOTE — Telephone Encounter (Signed)
 I called [facility] and spoke with Bernardine Bridegroom. I discussed message below from Dr Tresia Fruit and she will relay the request for UA and culture to Armin Landing, Dr Jonette Nestle nurse. I am also going to fax this note and the lab results to their office at [phone].   I spoke with the patient's daughter Anselmo Kings as well and let her know that I called Dr Jonette Nestle office to request the UA and culture repeat. She should be on the lookout for a call from them. Call us  if needed. Also informed her that if patient does have mental status changes or any concerning symptoms to go to the ER as this may be a UTI. She verbalized understanding and appreciation.   This note and labs faxed to Dr Jonette Nestle office. Received a receipt of confirmation.

## 2024-04-06 NOTE — Telephone Encounter (Signed)
 Received a fax back stating Macrobid was sent in.

## 2024-04-07 DIAGNOSIS — R8290 Unspecified abnormal findings in urine: Secondary | ICD-10-CM | POA: Diagnosis not present

## 2024-04-18 ENCOUNTER — Telehealth: Payer: Self-pay | Admitting: Neurology

## 2024-04-18 NOTE — Telephone Encounter (Signed)
 I called daughter of pt. Thyra) on DPR.  Since taking zoloft  25mg  po pm has noted inrceased drowsiness/ and nausea (vomiting x 2) with in the last 2 wks.  She has not noted any change in her depression and outbursts .  She would like to stop and see if she goes back to her baseline.  I told her she has only been on for 2wks. She can go head and stop and see how she does.  She appreciated call back.  Will let us  know if any other concerns or would like to start on another medication in future.  If any different from what I told her then will call her back other wise she will stop this pm.

## 2024-04-18 NOTE — Telephone Encounter (Signed)
 Pt's daughter called needing to speak to the MD or RN regarding the pt's reaction to the Zoloft . Pt is having nausea and sleeping a lot more than normal. Please advise.

## 2024-04-18 NOTE — Telephone Encounter (Signed)
 Agreed, thank you for telling her to stop. pLease place an allergy/intolerance to zoloft  on her chart with these side effects

## 2024-04-19 NOTE — Telephone Encounter (Signed)
 Done

## 2024-04-24 ENCOUNTER — Other Ambulatory Visit: Payer: Self-pay | Admitting: Neurology

## 2024-04-24 DIAGNOSIS — F03918 Unspecified dementia, unspecified severity, with other behavioral disturbance: Secondary | ICD-10-CM

## 2024-04-26 DIAGNOSIS — R8290 Unspecified abnormal findings in urine: Secondary | ICD-10-CM | POA: Diagnosis not present

## 2024-05-02 ENCOUNTER — Telehealth: Admitting: Neurology

## 2024-05-02 NOTE — Progress Notes (Deleted)
 PATIENT: Ronal DELENA Monte DOB: 12/26/1930  REASON FOR VISIT: follow up HISTORY FROM: patient PRIMARY NEUROLOGIST: Dr. Ines  No chief complaint on file.  CC: memory loss, depression  05/02/2024: She was started on zoloft  for about a week and having diarrhea and nausea and it was not increased. Dr. Lenon treated her UTI and she had a repeat that was negative. Right now, hold off on any other SSRI.    03/29/2024: Here for follow up on memory loss with daughter/dementia with behavioral problems. Patient hides a lot of things. Daughter states she didn;t want to take her medications and they have addressed. She will shower and dress independently although they have to remind her to shower and prompt her to shower. She fixes her own breakfast. Sits in a chair all day with her dog. She won;t get out of the house. Was hard to get her here today. Later appointments are better. Still lives with her 39 year old husband, he still drives, husband's memory is fine, he is there all the time to watch her . 2 daughters and 2 sons, daughters visit, father will call if needs. She misses things and thinks people took it. She has a Haematologist under her bed. Her daughter's email address is [email or web address].    HISTORY OF PRESENT ILLNESS: 07/06/2023:   BLAYKLEE MABLE is a 89 y.o. female with a history of Memory disturbance. Returns today for follow-up. Remains on Aricept  10 mg daily and Namenda  10 mg twice a day.  Reports that she tolerates the medicine well.  She continues to live at home with her husband.  Able to complete all ADLs independently.  She no longer cooks.  Has not driven in 20 years.  Denies any significant changes in her mood or behavior.  Returns today for follow-up with her daughter.   07/09/22: Ms. Donati is an 89 year old female with a history of memory disturbance.  She returns today for follow-up.  She feels that overall her memory has remained stable.  She is here today with her son.  She  is able to complete all ADLs independently.  Her daughter continues to help her with her appointments and medications.  Husband manages the finances.  Denies any change in her mood or behavior.  Reports that she sleeps well.  Good appetite.  Remains on Aricept  10 mg at bedtime and Namenda  10 mg twice a day. Son is interested in her trying the new IV drug leqembi   01/02/22: Ms. Wareing is an 89 year old female with a history of memory disturbance.  She returns today for follow-up with her son.  She feels that her memory has remained stable.  She lives at home with her husband.  Jerel her daughter helps with appointments and mediations. Able to complete all ADLS independently. Takes care of her little dog. Husband does the finances. Reports that she is sleeping well. No change in mood or behavior. Does not operate a motor vehicle. Last visit started on Aricept  10 mg at bedtime. Tolerating well.  She returns today for an evaluation  Ms. Dullea is an 89 year old female with a history of memory disturbance.  She returns today for follow-up.  She had neuropsychological evaluation with Dr. Jackquline.  The results of that test indicated a mild dementia perhaps Alzheimer's, vascular or a combination of both.  These results were reviewed with the patient on September 6.  Patient currently lives at home with her husband.  It seems that they do not have a  great relationship.  Patient is able to complete all ADLs independently.  She is able to complete household chores but they do have someone that comes in once a month to help.  Daughter reports that she is having some trouble managing her medications.  They are thinking of doing a pillbox or bubble pack.  Denies any significant changes in her mood or behavior.  Returns today for an evaluation.  HISTORY  LAURALYN SHADOWENS is a 89 y.o. female here as requested by [facility]* Dr. Gwenn for memory loss.  I reviewed Dr. Gwenn' notes: She has a past medical history of  memory problems, postsurgical hypothyroidism, hypertension, hyperlipidemia, osteopenia.  She has high blood pressure but not checking her pressures at home, she has hyperlipidemia her diet is not as good as it could be, she likes bologna and cheese wraps, she usually does take out dinner including meat loaf mashed potato, does not like vegetables, has a history of thyroid  cancer and is due for follow-up, patient's daughter accompanied her on the last visit and daughter was concerned that patient may be having some memory issues, patient has said that her husband is taking her things like her ID and her close, patient's husband says he is not but patient denies hallucinations.  CMP, TSH, thyroglobulin antibodies, CBC and B12 were ordered at this appointment June 19, 2020, TSH 26.63, BUN 12, creatinine 0.98, otherwise CMP unremarkable, CBC normal, thyroglobulin antibody less than 1, vitamin B12 296.     SHe lives with her husband, he thinks she is going crazy, she has been married 60+ years, patient says she thinks he is crazy. She doesn't think she is losing her memory. She has called him asshole but ongoing for 40 years. She went to her primary care and they did a memory test. She does forget things. The kids don;t live there so unsure what is really going on. Son hasn;t seen anything that is severe, she will forget things, she nmay forget some conversations, She denies any memory loss.  She quit driving because of vision. She has a depth perception issue, she recognized she had to stop driving. She says her husband likes other women, this has been ongoing, he takes things from her to give to other women. He came in one day upset, and felt sorry for the girls he works with and for examples with take her coat and give them to other people. He hasn't done it lately because she told him not to. Now she cusses him out. Son doesn't notice any personality changes. He says no changes in personality.Father dies  in 65s, mother was 60, no memory issues.     Was able to speak with son alone: She forgets things, she loses things, she asks the same questions over and over again, she acuses her husband of taking things and she gets them back. Anything that goes missing she acuses her husband of taking it, even the remote and then she denies she put it there. Son states no personality changes, she has always called her husband jack ass, the problem is the short-term memory loss and the denial that she is losing things. She does not take her medications. She is not eating well. She hasn't been to the doctor in years. Started years ago. She does not prepare meals but this may be a choice, she can still make a baked banana pudding, not cooking is by choice, she will eat ceral for breakfast and she will  eat sandwhich for lunch. Poor diet. Poor insight and judgement.    Reviewed notes, labs and imaging from outside physicians, which showed: see above    REVIEW OF SYSTEMS: Out of a complete 14 system review of symptoms, the patient complains only of the following symptoms, and all other reviewed systems are negative.  ALLERGIES: Allergies  Allergen Reactions   Iodinated Contrast Media Anaphylaxis   Bextra [Valdecoxib] Nausea And Vomiting   Ceclor [Cefaclor] Nausea And Vomiting   Celebrex [Celecoxib] Nausea And Vomiting   Codeine Other (See Comments)    Makes head feel like its going to explode   Demerol     Hallucinate    Doxycycline Nausea And Vomiting   Elemental Sulfur Nausea And Vomiting   Erythromycin Nausea And Vomiting   Meloxicam Nausea And Vomiting   Morphine And Codeine Nausea And Vomiting and Other (See Comments)    Loopy, knocked out   Penicillins Nausea And Vomiting    Has patient had a PCN reaction causing immediate rash, facial/tongue/throat swelling, SOB or lightheadedness with hypotension: no Has patient had a PCN reaction causing severe rash involving mucus membranes or skin necrosis:  no Has patient had a PCN reaction that required hospitalization: no Has patient had a PCN reaction occurring within the last 10 years: no If all of the above answers are NO, then may proceed with Cephalosporin use.    Scopolamine     Hallucinate    Tramadol Nausea And Vomiting   Vicodin [Hydrocodone-Acetaminophen ]     hallucinate   Vioxx [Rofecoxib] Nausea And Vomiting   Zoloft  [Sertraline ] Nausea And Vomiting and Other (See Comments)    Drowsiness   Quinine Derivatives Rash    HOME MEDICATIONS: Outpatient Medications Prior to Visit  Medication Sig Dispense Refill   acetaminophen  (TYLENOL ) 325 MG tablet Take 2 tablets (650 mg total) by mouth every 6 (six) hours as needed. 15 tablet 0   baclofen  (LIORESAL ) 10 MG tablet Take 0.5 tablets (5 mg total) by mouth at bedtime as needed for muscle spasms. 10 each 0   bisoprolol (ZEBETA) 10 MG tablet Take 5 mg by mouth 2 (two) times daily. (Patient not taking: Reported on 07/06/2023)     diclofenac  Sodium (VOLTAREN ) 1 % GEL Apply 4 g topically 4 (four) times daily. 100 g 1   donepezil  (ARICEPT ) 10 MG tablet Take 1 tablet (10 mg total) by mouth at bedtime. 90 tablet 3   memantine  (NAMENDA ) 10 MG tablet Take 1 tablet (10 mg total) by mouth 2 (two) times daily. 180 tablet 3   ondansetron  (ZOFRAN -ODT) 4 MG disintegrating tablet Take 1 tablet (4 mg total) by mouth every 8 (eight) hours as needed. 10 tablet 0   RESTASIS 0.05 % ophthalmic emulsion Place 1 drop into both eyes as needed.      sertraline  (ZOLOFT ) 25 MG tablet Start with one pill at bedtime and, if no side effects in 2 weeks, increase to 2 pills at bedtime 60 tablet 6   SYNTHROID 75 MCG tablet Take 75 mcg by mouth daily.     valsartan-hydrochlorothiazide (DIOVAN-HCT) 160-25 MG per tablet Take 1 tablet by mouth daily.       No facility-administered medications prior to visit.    PAST MEDICAL HISTORY: Past Medical History:  Diagnosis Date   Allergy    Bleeding nose    Hyperlipidemia     Hypertension    Kidney stones    Memory problem    Nasal fracture 2024   dog tripped  her and she fell   Osteopenia    Postsurgical hypothyroidism    Thyroid  cancer (HCC)    papillary    PAST SURGICAL HISTORY: Past Surgical History:  Procedure Laterality Date   ABDOMINAL HYSTERECTOMY     partial   APPENDECTOMY     BACK SURGERY     rod and screws   BREAST SURGERY     augmentation   CARDIAC CATHETERIZATION     CATARACT EXTRACTION     both   EYE SURGERY     both   LAMINECTOMY     NASAL SEPTUM SURGERY     OOPHORECTOMY     SHOULDER SURGERY     left and right   SINUS IRRIGATION     SUBACROMIAL DECOMPRESSION     THYROIDECTOMY  2012    FAMILY HISTORY: Family History  Problem Relation Age of Onset   Cancer Mother        breast   Stroke Mother    Heart disease Father    Cancer Father        lung   Asthma Father    Allergic rhinitis Father    Dementia Neg Hx    Alzheimer's disease Neg Hx     SOCIAL HISTORY: Social History   Socioeconomic History   Marital status: Married    Spouse name: Not on file   Number of children: 4   Years of education: Not on file   Highest education level: Associate degree: occupational, Scientist, product/process development, or vocational program  Occupational History    Comment: retired  Tobacco Use   Smoking status: Never   Smokeless tobacco: Never  Vaping Use   Vaping status: Never Used  Substance and Sexual Activity   Alcohol use: Not Currently    Comment: rare, wine   Drug use: Never   Sexual activity: Not on file  Other Topics Concern   Not on file  Social History Narrative   08/23/20 lives with husband   Caffeine- 1 cup/day   Retired    Chief Executive Officer D[employer] of Corporate investment banker Strain: Low Risk  (08/17/2023)   Received from [facility]   Overall Financial Resource Strain (CARDIA)    Difficulty of Paying Living Expenses: Not hard at all  Food Insecurity: No Food Insecurity (08/17/2023)   Received from [facility]   Hunger Vital Sign    Within the past 12 months, you worried that your food would run out before you got the money to buy more.: Never true    Within the past 12 months, the food you bought just didn't last and you didn't have money to get more.: Never true  Transportation Needs: No Transportation Needs (08/17/2023)   Received from [facility] - Transportation    In the past 12 months, has lack of transportation kept you from medical appointments or from getting medications?: No    Lack of Transportation (Non-Medical): No  Physical Activity: Not on file  Stress: Not on file  Social Connections: Not on file  Intimate Partner Violence: Not on file      PHYSICAL EXAM  There were no vitals filed for this visit.    There is no height or weight on file to calculate BMI.    Physical exam: Exam: Gen: NAD, conversant      CV: No palpitations or chest pain or SOB. VS: Breathing at a normal rate. Not febrile. Eyes: Conjunctivae clear without exudates  or hemorrhage  Neuro: Detailed Neurologic Exam  Speech:    Speech is normal; fluent and spontaneous with normal comprehension.  Cognition:    03/29/2024   11:27 AM 07/06/2023    1:53 PM 07/09/2022    2:13 PM  MMSE - Mini Mental State Exam  Orientation to time 1 2 3   Orientation to Place 1 4 2   Registration 3 3 3   Attention/ Calculation 4 3 3   Recall 1 0 1  Language- name 2 objects 2 2 2   Language- repeat 1 1 1   Language- follow 3 step command 3 3 3   Language- read & follow direction 1 1 1   Write a sentence 1 0 1  Copy design 1 1 1   Total score 19 20 21     Cranial Nerves:    The pupils are equal, round, and reactive to light. Visual fields are full Extraocular movements are intact.  The face is symmetric with normal sensation. The palate elevates in the midline. Hearing intact. Voice is normal. Shoulder shrug is normal. The tongue has normal motion without fasciculations.    Coordination: normal  Gait:    No abnormalities noted or reported  Motor Observation:   no involuntary movements noted. Tone:    Appears normal  Posture:    Posture is normal. normal erect    Strength:    Strength is anti-gravity and symmetric in the upper and lower limbs.      Sensation: intact to LT, no reports of numbness or tingling or paresthesias        DIAGNOSTIC DATA (LABS, IMAGING, TESTING) - I reviewed patient records, labs, notes, testing and imaging myself where available.  Lab Results  Component Value Date   WBC 10.7 03/29/2024   HGB 13.7 03/29/2024   HCT 42.3 03/29/2024   MCV 95 03/29/2024   PLT 292 03/29/2024      Component Value Date/Time   NA 139 03/29/2024 1250   K 4.6 03/29/2024 1250   CL 98 03/29/2024 1250   CO2 25 03/29/2024 1250   GLUCOSE 100 (H) 03/29/2024 1250   GLUCOSE 131 (H) 07/20/2022 1934   BUN 24 03/29/2024 1250   CREATININE 1.37 (H) 03/29/2024 1250   CALCIUM 10.0 03/29/2024 1250   PROT 7.2 03/29/2024 1250   ALBUMIN 4.5 03/29/2024 1250   AST 19 03/29/2024 1250   ALT 18 03/29/2024 1250   ALKPHOS 99 03/29/2024 1250   BILITOT 0.5 03/29/2024 1250   GFRNONAA 37 (L) 07/20/2022 1934   GFRAA 59 (L) 08/23/2020 1507     ASSESSMENT AND PLAN 89 y.o. year old female  has a past medical history of Allergy, Bleeding nose, Hyperlipidemia, Hypertension, Kidney stones, Memory problem, Nasal fracture (2024), Osteopenia, Postsurgical hypothyroidism, and Thyroid  cancer (HCC). here with:  1.  Mild dementia possibly Alzheimer's, vascular or both  -- Continue Aricept  10 mg at bedtime --Continue Namenda  10 mg twice a day -- Memory score is slightly decreased MMSE 19/30 (21 in 2023) - we discussed her behavioral problems and depression which can be related to dementia and often times is.  - stressed quality of life, strategies for management - Start zoloft  25mg  and slowly increase to 50mg  in 2 weeks - Touch base in 4 weeks to see how it is  going - will check some labs, while patient waiting in lab area was able to speak alone with daughter, very difficult for families of patients with dementia  No orders of the defined types were placed in this encounter.  No orders of the defined types were placed in this encounter.     Onetha Epp [facility] [address], [address] [place], [place] [address] [phone]  I spent 32 minutes of face-to-face and non-face-to-face time with patient on the  No diagnosis found.  diagnosis.  This included previsit chart review, lab review, study review, order entry, electronic health record documentation, patient education on the different diagnostic and therapeutic options, counseling and coordination of care, risks and benefits of management, compliance, or risk factor reduction

## 2024-06-22 ENCOUNTER — Other Ambulatory Visit: Payer: Self-pay | Admitting: Neurology

## 2024-06-22 DIAGNOSIS — F02B18 Dementia in other diseases classified elsewhere, moderate, with other behavioral disturbance: Secondary | ICD-10-CM

## 2024-07-15 ENCOUNTER — Other Ambulatory Visit: Payer: Self-pay | Admitting: Adult Health

## 2024-07-30 ENCOUNTER — Other Ambulatory Visit: Payer: Self-pay | Admitting: Adult Health

## 2024-08-02 ENCOUNTER — Telehealth: Payer: Self-pay | Admitting: *Deleted

## 2024-08-02 NOTE — Telephone Encounter (Signed)
 Spoke with patient's daughter, she stated that the patient normally comes here once a year. She is scheduled for a follow up with Dr. Margaret for 04/11/25 at 1:30pm

## 2025-04-11 ENCOUNTER — Ambulatory Visit: Admitting: Diagnostic Neuroimaging
